# Patient Record
Sex: Female | Born: 1950
Health system: Southern US, Community
[De-identification: ages and names within clinical notes are randomized; demographics above are authoritative.]

## PROBLEM LIST (undated history)

## (undated) DIAGNOSIS — E213 Hyperparathyroidism, unspecified: Secondary | ICD-10-CM

## (undated) DIAGNOSIS — F419 Anxiety disorder, unspecified: Secondary | ICD-10-CM

## (undated) DIAGNOSIS — J189 Pneumonia, unspecified organism: Secondary | ICD-10-CM

## (undated) DIAGNOSIS — I10 Essential (primary) hypertension: Secondary | ICD-10-CM

## (undated) DIAGNOSIS — E785 Hyperlipidemia, unspecified: Secondary | ICD-10-CM

## (undated) DIAGNOSIS — E559 Vitamin D deficiency, unspecified: Secondary | ICD-10-CM

## (undated) DIAGNOSIS — K219 Gastro-esophageal reflux disease without esophagitis: Secondary | ICD-10-CM

## (undated) DIAGNOSIS — J449 Chronic obstructive pulmonary disease, unspecified: Secondary | ICD-10-CM

## (undated) DIAGNOSIS — M1990 Unspecified osteoarthritis, unspecified site: Secondary | ICD-10-CM

## (undated) HISTORY — PX: APPENDECTOMY: SHX54

## (undated) HISTORY — DX: Hyperparathyroidism, unspecified: E21.3

## (undated) HISTORY — DX: Essential (primary) hypertension: I10

## (undated) HISTORY — DX: Hyperlipidemia, unspecified: E78.5

## (undated) HISTORY — DX: Vitamin D deficiency, unspecified: E55.9

## (undated) HISTORY — DX: Gastro-esophageal reflux disease without esophagitis: K21.9

---

## 1998-07-23 ENCOUNTER — Ambulatory Visit (HOSPITAL_COMMUNITY): Admission: RE | Admit: 1998-07-23 | Discharge: 1998-07-23 | Payer: Self-pay | Admitting: Family Medicine

## 2001-10-01 ENCOUNTER — Encounter: Payer: Self-pay | Admitting: Internal Medicine

## 2001-10-01 ENCOUNTER — Ambulatory Visit (HOSPITAL_COMMUNITY): Admission: RE | Admit: 2001-10-01 | Discharge: 2001-10-01 | Payer: Self-pay | Admitting: Internal Medicine

## 2002-01-28 ENCOUNTER — Ambulatory Visit (HOSPITAL_COMMUNITY): Admission: RE | Admit: 2002-01-28 | Discharge: 2002-01-28 | Payer: Self-pay | Admitting: Internal Medicine

## 2002-01-28 ENCOUNTER — Encounter: Payer: Self-pay | Admitting: Internal Medicine

## 2002-10-03 ENCOUNTER — Encounter: Payer: Self-pay | Admitting: Internal Medicine

## 2002-10-03 ENCOUNTER — Ambulatory Visit (HOSPITAL_COMMUNITY): Admission: RE | Admit: 2002-10-03 | Discharge: 2002-10-03 | Payer: Self-pay | Admitting: Internal Medicine

## 2003-10-02 ENCOUNTER — Emergency Department (HOSPITAL_COMMUNITY): Admission: EM | Admit: 2003-10-02 | Discharge: 2003-10-02 | Payer: Self-pay | Admitting: Emergency Medicine

## 2005-03-06 ENCOUNTER — Emergency Department (HOSPITAL_COMMUNITY): Admission: EM | Admit: 2005-03-06 | Discharge: 2005-03-06 | Payer: Self-pay | Admitting: Emergency Medicine

## 2006-02-03 ENCOUNTER — Ambulatory Visit (HOSPITAL_COMMUNITY): Admission: RE | Admit: 2006-02-03 | Discharge: 2006-02-03 | Payer: Self-pay | Admitting: Internal Medicine

## 2006-04-21 ENCOUNTER — Ambulatory Visit (HOSPITAL_COMMUNITY): Admission: RE | Admit: 2006-04-21 | Discharge: 2006-04-21 | Payer: Self-pay | Admitting: Internal Medicine

## 2006-09-03 ENCOUNTER — Ambulatory Visit (HOSPITAL_COMMUNITY): Admission: RE | Admit: 2006-09-03 | Discharge: 2006-09-03 | Payer: Self-pay | Admitting: Gastroenterology

## 2006-09-03 ENCOUNTER — Ambulatory Visit: Payer: Self-pay | Admitting: Gastroenterology

## 2006-09-03 HISTORY — PX: COLONOSCOPY: SHX174

## 2007-02-24 ENCOUNTER — Ambulatory Visit (HOSPITAL_COMMUNITY): Admission: RE | Admit: 2007-02-24 | Discharge: 2007-02-24 | Payer: Self-pay | Admitting: Internal Medicine

## 2007-06-10 ENCOUNTER — Ambulatory Visit (HOSPITAL_COMMUNITY): Admission: RE | Admit: 2007-06-10 | Discharge: 2007-06-10 | Payer: Self-pay | Admitting: Internal Medicine

## 2008-09-19 ENCOUNTER — Ambulatory Visit (HOSPITAL_COMMUNITY): Admission: RE | Admit: 2008-09-19 | Discharge: 2008-09-19 | Payer: Self-pay | Admitting: Internal Medicine

## 2009-10-04 ENCOUNTER — Ambulatory Visit (HOSPITAL_COMMUNITY): Admission: RE | Admit: 2009-10-04 | Discharge: 2009-10-04 | Payer: Self-pay | Admitting: Internal Medicine

## 2010-10-07 ENCOUNTER — Ambulatory Visit (HOSPITAL_COMMUNITY): Admission: RE | Admit: 2010-10-07 | Discharge: 2010-10-07 | Payer: Self-pay | Admitting: Internal Medicine

## 2010-12-29 ENCOUNTER — Encounter: Payer: Self-pay | Admitting: Internal Medicine

## 2011-04-25 NOTE — Op Note (Signed)
[name], Stephanie [name]              ACCOUNT NO.:  [id number]   MEDICAL RECORD NO.:  [id number]          PATIENT TYPE:  AMB   LOCATION:  DAY                           FACILITY:  [facility]   PHYSICIAN:  Stephanie Stephanie [name], M.D.      DATE OF BIRTH:  23-Oct-1951   DATE OF PROCEDURE:  DATE OF DISCHARGE:  09/03/2006                                 OPERATIVE REPORT   DATE OF PROCEDURE:  09/03/2006   REFERRING PHYSICIAN:  Tesfaye D. Legrand Rams, MD   PROCEDURE:  Colonoscopy   FINDINGS:  1. Rare diverticulosis in the sigmoid colon.  Otherwise normal colon      without evidence of polyps, masses, inflammatory changes or vascular      ectasias.  2. Large internal hemorrhoids.  Otherwise normal retroflexed view of the      rectum.   RECOMMENDATIONS:  1. Screening colonoscopy in 10 years.  2. High fiber diet.  Handout given on high fiber diet, internal      hemorrhoids and diverticulosis.  3. Follow up with Dr. Legrand [name].   MEDICATIONS:  1. Demerol 100 mg IV  2. Versed 4 mg IV.   PROCEDURE TECHNIQUE:  Physical exam was performed and informed consent was  obtained from the patient after explaining the benefits, risks and  alternatives to the procedure which the patient appeared to understand and  so stated.  The patient was connected to the monitor and placed in the left  lateral position.  Continuous oxygen was provided by nasal cannula and IV  medicine administered through an indwelling cannula.  After administration  of sedation and rectal exam, the patient's rectum was intubated.  The scope  was advanced under direct visualization to  the cecum.  The scope was withdrawn slowly by carefully examining the color,  texture, anatomy and integrity of the mucosa on the way out.  The patient  was recovered in the endoscopy suite and discharged home in satisfactory  condition.      Stephanie Stephanie [name], M.D.  Electronically Signed     SM/MEDQ  D:  09/03/2006  T:  09/05/2006  Job:  [id number]   cc:   Stephanie D.  Legrand Rams, MD  Fax: [phone]

## 2011-09-30 ENCOUNTER — Other Ambulatory Visit (HOSPITAL_COMMUNITY): Payer: Self-pay | Admitting: Internal Medicine

## 2011-09-30 DIAGNOSIS — Z139 Encounter for screening, unspecified: Secondary | ICD-10-CM

## 2011-10-10 ENCOUNTER — Ambulatory Visit (HOSPITAL_COMMUNITY)
Admission: RE | Admit: 2011-10-10 | Discharge: 2011-10-10 | Disposition: A | Discharge status: Home / Self Care | Payer: 59 | Source: Ambulatory Visit | Attending: Internal Medicine | Admitting: Internal Medicine

## 2011-10-10 DIAGNOSIS — Z1231 Encounter for screening mammogram for malignant neoplasm of breast: Secondary | ICD-10-CM | POA: Insufficient documentation

## 2011-10-10 DIAGNOSIS — Z139 Encounter for screening, unspecified: Secondary | ICD-10-CM

## 2011-10-13 ENCOUNTER — Ambulatory Visit (HOSPITAL_COMMUNITY): Payer: Self-pay

## 2014-10-16 ENCOUNTER — Ambulatory Visit (HOSPITAL_COMMUNITY)
Admission: RE | Admit: 2014-10-16 | Discharge: 2014-10-16 | Disposition: A | Discharge status: Home / Self Care | Payer: 59 | Source: Ambulatory Visit | Attending: Internal Medicine | Admitting: Internal Medicine

## 2014-10-16 ENCOUNTER — Other Ambulatory Visit (HOSPITAL_COMMUNITY): Payer: Self-pay | Admitting: Internal Medicine

## 2014-10-16 DIAGNOSIS — F17200 Nicotine dependence, unspecified, uncomplicated: Secondary | ICD-10-CM | POA: Insufficient documentation

## 2014-10-16 DIAGNOSIS — M25551 Pain in right hip: Secondary | ICD-10-CM | POA: Diagnosis not present

## 2014-10-16 DIAGNOSIS — J9809 Other diseases of bronchus, not elsewhere classified: Secondary | ICD-10-CM

## 2015-09-10 ENCOUNTER — Other Ambulatory Visit (HOSPITAL_COMMUNITY): Payer: Self-pay | Admitting: Internal Medicine

## 2015-09-10 DIAGNOSIS — Z1231 Encounter for screening mammogram for malignant neoplasm of breast: Secondary | ICD-10-CM

## 2015-09-21 ENCOUNTER — Ambulatory Visit (HOSPITAL_COMMUNITY)
Admission: RE | Admit: 2015-09-21 | Discharge: 2015-09-21 | Disposition: A | Discharge status: Home / Self Care | Payer: 59 | Source: Ambulatory Visit | Attending: Internal Medicine | Admitting: Internal Medicine

## 2015-09-21 DIAGNOSIS — Z1231 Encounter for screening mammogram for malignant neoplasm of breast: Secondary | ICD-10-CM | POA: Diagnosis not present

## 2016-03-18 ENCOUNTER — Telehealth: Payer: Self-pay

## 2016-03-18 NOTE — Telephone Encounter (Signed)
I called pt. She had her last colonoscopy 09/03/2006 by Dr. Oneida Alar. She was on recall for 08/2016. Pt said she had a recent episode of bloody diarrhea.  OV with Neil Crouch, PA on 04/11/2016 at 10:00 Am .

## 2016-03-18 NOTE — Telephone Encounter (Signed)
Pt had called yesterday to speak with DS. I told her that DS had already left for the day. Pt received a triage letter.  She said that she doesn't get home from work until 330pm and for DS to call her then. [id number]

## 2016-04-11 ENCOUNTER — Ambulatory Visit: Payer: 59 | Admitting: Gastroenterology

## 2016-04-25 ENCOUNTER — Ambulatory Visit (INDEPENDENT_AMBULATORY_CARE_PROVIDER_SITE_OTHER): Payer: Managed Care, Other (non HMO) | Admitting: Gastroenterology

## 2016-04-25 ENCOUNTER — Encounter: Payer: Self-pay | Admitting: Gastroenterology

## 2016-04-25 ENCOUNTER — Other Ambulatory Visit: Payer: Self-pay

## 2016-04-25 VITALS — BP 144/84 | HR 68 | Temp 97.6°F | Ht 62.0 in | Wt 130.0 lb

## 2016-04-25 DIAGNOSIS — K529 Noninfective gastroenteritis and colitis, unspecified: Secondary | ICD-10-CM | POA: Diagnosis not present

## 2016-04-25 DIAGNOSIS — Z1211 Encounter for screening for malignant neoplasm of colon: Secondary | ICD-10-CM | POA: Diagnosis not present

## 2016-04-25 MED ORDER — PEG 3350-KCL-NA BICARB-NACL 420 G PO SOLR: 4000.0000 mL | Freq: Once | ORAL | Status: DC

## 2016-04-25 NOTE — Progress Notes (Signed)
Primary Care Physician:  Rosita Fire, MD  Primary Gastroenterologist:  Barney Drain, MD   Chief Complaint  Patient presents with  . Colonoscopy    HPI:  Stephanie [name] is a 65 y.o. female here to schedule screening colonoscopy. Her last colonoscopy in 2007 showed rare diverticulosis and large internal hemorrhoids. She typically has BM about every other day. Usually can have BM after coffee. No melena. Recently had viral gastroenteritis with diarrhea for 2 days. Noted some brbpr with mucous after several bouts of diarrhea. Completely resolved. Has taken omeprazole on occasion for epigastric pain over the past 18 months. Will take for a day or until every few months. Last time over three months ago. No dysphagia or vomiting. No unintentional weight loss.     Current Outpatient Prescriptions  Medication Sig Dispense Refill  . losartan-hydrochlorothiazide (HYZAAR) 100-25 MG tablet Take 1 tablet by mouth daily.     . naproxen (NAPROSYN) 500 MG tablet Take 500 mg by mouth as needed.    Marland Kitchen omeprazole (PRILOSEC) 20 MG capsule Take 20 mg by mouth daily.    . simvastatin (ZOCOR) 20 MG tablet Take 20 mg by mouth daily at 6 PM.      No current facility-administered medications for this visit.    Allergies as of 04/25/2016  . (No Known Allergies)    Past Medical History  Diagnosis Date  . HTN (hypertension)   . Hyperlipidemia   . GERD (gastroesophageal reflux disease)     Past Surgical History  Procedure Laterality Date  . Colonoscopy  09/03/2006    [id number] diverticulosis in the sigmoid colon. Otherwise normal colon  . Appendectomy      Family History  Problem Relation Age of Onset  . Colon cancer Neg Hx     Social History   Social History  . Marital Status: Legally Separated    Spouse Name: N/A  . Number of Children: 1  . Years of Education: N/A   Occupational History  . inspector    Social History Main Topics  . Smoking status: Current Every Day Smoker -- 1.00  packs/day    Types: Cigarettes  . Smokeless tobacco: Not on file  . Alcohol Use: 3.6 oz/week    6 Standard drinks or equivalent per week     Comment: 6 pack in a week  . Drug Use: No  . Sexual Activity: Not on file   Other Topics Concern  . Not on file   Social History Narrative  . No narrative on file      ROS:  General: Negative for anorexia, weight loss, fever, chills, fatigue, weakness. Eyes: Negative for vision changes.  ENT: Negative for hoarseness, difficulty swallowing , nasal congestion. CV: Negative for chest pain, angina, palpitations, dyspnea on exertion, peripheral edema.  Respiratory: Negative for dyspnea at rest, dyspnea on exertion, cough, sputum, wheezing.  GI: See history of present illness. GU:  Negative for dysuria, hematuria, urinary incontinence, urinary frequency, nocturnal urination.  MS: Negative for joint pain, low back pain.  Derm: Negative for rash or itching.  Neuro: Negative for weakness, abnormal sensation, seizure, frequent headaches, memory loss, confusion.  Psych: Negative for anxiety, depression, suicidal ideation, hallucinations.  Endo: Negative for unusual weight change.  Heme: Negative for bruising or bleeding. Allergy: Negative for rash or hives.    Physical Examination:  BP 144/84 mmHg  Pulse 68  Temp(Src) 97.6 F (36.4 C) (Oral)  Ht 5\' 2"  (1.575 m)  Wt 130 lb (58.968 kg)  BMI 23.77  kg/m2   General: Well-nourished, well-developed in no acute distress.  Head: Normocephalic, atraumatic.   Eyes: Conjunctiva pink, no icterus. Mouth: Oropharyngeal mucosa moist and pink , no lesions erythema or exudate. Neck: Supple without thyromegaly, masses, or lymphadenopathy.  Lungs: Clear to auscultation bilaterally.  Heart: Regular rate and rhythm, no murmurs rubs or gallops.  Abdomen: Bowel sounds are normal, nontender, nondistended, no hepatosplenomegaly or masses, no abdominal bruits or    hernia , no rebound or guarding.   Rectal: not  performed Extremities: No lower extremity edema. No clubbing or deformities.  Neuro: Alert and oriented x 4 , grossly normal neurologically.  Skin: Warm and dry, no rash or jaundice.   Psych: Alert and cooperative, normal mood and affect.

## 2016-04-25 NOTE — Assessment & Plan Note (Signed)
65 y/o female who presents to schedule 10 year follow up screening colonoscopy (average risk). Recently had short-lived diarrhea with small volume brbpr with h/o hemorrhoids. Suspected benign anorectal bleeding in setting of viral gastroenteritis. Symptoms resolved. Colonoscopy in near future.  I have discussed the risks, alternatives, benefits with regards to but not limited to the risk of reaction to medication, bleeding, infection, perforation and the patient is agreeable to proceed. Written consent to be obtained.

## 2016-04-25 NOTE — Patient Instructions (Signed)
1. Colonoscopy in the near future. Please see separate instructions.

## 2016-04-28 NOTE — Progress Notes (Signed)
cc'd to pcp 

## 2016-05-23 ENCOUNTER — Ambulatory Visit (HOSPITAL_COMMUNITY)
Admission: RE | Admit: 2016-05-23 | Discharge: 2016-05-23 | Disposition: A | Discharge status: Home / Self Care | Payer: Managed Care, Other (non HMO) | Source: Ambulatory Visit | Attending: Gastroenterology | Admitting: Gastroenterology

## 2016-05-23 ENCOUNTER — Encounter (HOSPITAL_COMMUNITY)
Admission: RE | Disposition: A | Discharge status: Home / Self Care | Payer: Self-pay | Source: Ambulatory Visit | Attending: Gastroenterology

## 2016-05-23 ENCOUNTER — Encounter (HOSPITAL_COMMUNITY): Payer: Self-pay | Admitting: *Deleted

## 2016-05-23 DIAGNOSIS — Q438 Other specified congenital malformations of intestine: Secondary | ICD-10-CM | POA: Insufficient documentation

## 2016-05-23 DIAGNOSIS — Z79899 Other long term (current) drug therapy: Secondary | ICD-10-CM | POA: Insufficient documentation

## 2016-05-23 DIAGNOSIS — Z7982 Long term (current) use of aspirin: Secondary | ICD-10-CM | POA: Insufficient documentation

## 2016-05-23 DIAGNOSIS — K219 Gastro-esophageal reflux disease without esophagitis: Secondary | ICD-10-CM | POA: Diagnosis not present

## 2016-05-23 DIAGNOSIS — E785 Hyperlipidemia, unspecified: Secondary | ICD-10-CM | POA: Diagnosis not present

## 2016-05-23 DIAGNOSIS — Z1211 Encounter for screening for malignant neoplasm of colon: Secondary | ICD-10-CM | POA: Diagnosis not present

## 2016-05-23 DIAGNOSIS — K5730 Diverticulosis of large intestine without perforation or abscess without bleeding: Secondary | ICD-10-CM | POA: Diagnosis not present

## 2016-05-23 DIAGNOSIS — I10 Essential (primary) hypertension: Secondary | ICD-10-CM | POA: Insufficient documentation

## 2016-05-23 DIAGNOSIS — F17210 Nicotine dependence, cigarettes, uncomplicated: Secondary | ICD-10-CM | POA: Diagnosis not present

## 2016-05-23 DIAGNOSIS — K648 Other hemorrhoids: Secondary | ICD-10-CM | POA: Diagnosis not present

## 2016-05-23 HISTORY — PX: COLONOSCOPY: SHX5424

## 2016-05-23 SURGERY — COLONOSCOPY
Anesthesia: Moderate Sedation

## 2016-05-23 MED ORDER — STERILE WATER FOR IRRIGATION IR SOLN
Status: DC | PRN
  Administered 2016-05-23: 10:00:00

## 2016-05-23 MED ORDER — MIDAZOLAM HCL 5 MG/5ML IJ SOLN
INTRAMUSCULAR | Status: DC | PRN
  Administered 2016-05-23: 2 mg via INTRAVENOUS
  Administered 2016-05-23: 1 mg via INTRAVENOUS
  Administered 2016-05-23: 2 mg via INTRAVENOUS

## 2016-05-23 MED ORDER — MEPERIDINE HCL 100 MG/ML IJ SOLN
INTRAMUSCULAR | Status: DC
Start: 2016-05-23 — End: 2016-05-23
  Filled 2016-05-23: qty 2

## 2016-05-23 MED ORDER — MIDAZOLAM HCL 5 MG/5ML IJ SOLN
INTRAMUSCULAR | Status: AC
  Filled 2016-05-23: qty 10

## 2016-05-23 MED ORDER — MEPERIDINE HCL 100 MG/ML IJ SOLN
INTRAMUSCULAR | Status: DC | PRN
  Administered 2016-05-23 (×2): 50 mg via INTRAVENOUS

## 2016-05-23 MED ORDER — SODIUM CHLORIDE 0.9 % IV SOLN
INTRAVENOUS | Status: DC
  Administered 2016-05-23: 09:00:00 via INTRAVENOUS

## 2016-05-23 NOTE — H&P (Signed)
  Primary Care Physician:  Rosita Fire, MD Primary Gastroenterologist:  Dr. Oneida Alar  Pre-Procedure History & Physical: HPI:  Stephanie [name] is a 65 y.o. female here for [place].  Past Medical History  Diagnosis Date  . HTN (hypertension)   . Hyperlipidemia   . GERD (gastroesophageal reflux disease)     Past Surgical History  Procedure Laterality Date  . Colonoscopy  09/03/2006    [id number] diverticulosis in the sigmoid colon. Otherwise normal colon  . Appendectomy      Prior to Admission medications   Medication Sig Start Date End Date Taking? Authorizing Provider  aspirin EC 81 MG tablet Take 81 mg by mouth daily.   Yes Historical Provider, MD  losartan-hydrochlorothiazide (HYZAAR) 100-25 MG tablet Take 1 tablet by mouth daily.  04/03/16  Yes Historical Provider, MD  naproxen (NAPROSYN) 500 MG tablet Take 500 mg by mouth as needed.   Yes Historical Provider, MD  omeprazole (PRILOSEC) 20 MG capsule Take 20 mg by mouth daily.   Yes Historical Provider, MD  polyethylene glycol-electrolytes (NULYTELY/GOLYTELY) 420 g solution Take 4,000 mLs by mouth once. 04/25/16  Yes Mahala Menghini, PA-C  simvastatin (ZOCOR) 20 MG tablet Take 20 mg by mouth daily at 6 PM.  04/03/16  Yes Historical Provider, MD    Allergies as of 04/25/2016  . (No Known Allergies)    Family History  Problem Relation Age of Onset  . Colon cancer Neg Hx     Social History   Social History  . Marital Status: Legally Separated    Spouse Name: N/A  . Number of Children: 1  . Years of Education: N/A   Occupational History  . inspector    Social History Main Topics  . Smoking status: Current Every Day Smoker -- 1.00 packs/day for 20 years    Types: Cigarettes  . Smokeless tobacco: Not on file  . Alcohol Use: 7.2 oz/week    6 Standard drinks or equivalent, 6 Cans of beer per week     Comment: 6 pack in a week  . Drug Use: No  . Sexual Activity: Not on file   Other Topics Concern  . Not  on file   Social History Narrative    Review of Systems: See HPI, otherwise negative ROS   Physical Exam: BP   Pulse 70  Temp(Src) 97.5 F (36.4 C) (Oral)  Resp 19  Ht 5\' 2"  (1.575 m)  Wt 130 lb (58.968 kg)  BMI 23.77 kg/m2  SpO2 99% General:   Alert,  pleasant and cooperative in NAD Head:  Normocephalic and atraumatic. Neck:  Supple; Lungs:  Clear throughout to auscultation.    Heart:  Regular rate and rhythm. Abdomen:  Soft, nontender and nondistended. Normal bowel sounds, without guarding, and without rebound.   Neurologic:  Alert and  oriented x4;  grossly normal neurologically.  Impression/Plan:     SCREENING  Plan:  1. TCS TODAY

## 2016-05-23 NOTE — Progress Notes (Signed)
REVIEWED-NO ADDITIONAL RECOMMENDATIONS. 

## 2016-05-23 NOTE — OR Nursing (Signed)
Dr. Oneida Alar notified of increased BP 215/81, 205/83, pulse 65-68 and of patient taking Hyzaar yesterday. No orders received, will proceed with procedure.

## 2016-05-23 NOTE — Op Note (Signed)
[facility] Patient Name: Stephanie [name] Procedure Date: 05/23/2016 9:38 AM MRN: [id number] Date of Birth: 09-08-1951 Attending MD: Barney Drain , MD CSN: [id number] Age: 65 Admit Type: Outpatient Procedure:                Colonoscopy, SCREENING Indications:              Screening for colorectal malignant neoplasm Providers:                Barney Drain, MD, Janeece Riggers, RN, Randa Spike,                            Technician Referring MD:             Rosita Fire Medicines:                Meperidine 100 mg IV, Midazolam 5 mg IV Complications:            No immediate complications. Estimated Blood Loss:     Estimated blood loss was minimal. Procedure:                Pre-Anesthesia Assessment:                           - Prior to the procedure, a History and Physical                            was performed, and patient medications and                            allergies were reviewed. The patient's tolerance of                            previous anesthesia was also reviewed. The risks                            and benefits of the procedure and the sedation                            options and risks were discussed with the patient.                            All questions were answered, and informed consent                            was obtained. Prior Anticoagulants: The patient has                            taken aspirin, last dose was day of procedure. ASA                            Grade Assessment: II - A patient with mild systemic                            disease. After reviewing the risks and benefits,  the patient was deemed in satisfactory condition to                            undergo the procedure.                           After obtaining informed consent, the colonoscope                            was passed under direct vision. Throughout the                            procedure, the patient's blood pressure, pulse, and                     oxygen saturations were monitored continuously. The                            [id number]) scope was introduced through                            the anus and advanced to the the cecum, identified                            by appendiceal orifice and ileocecal valve. The                            ileocecal valve, appendiceal orifice, and rectum                            were photographed. The colonoscopy was somewhat                            difficult due to a tortuous colon. Successful                            completion of the procedure was aided by                            straightening and shortening the scope to obtain                            bowel loop reduction. The quality of the bowel                            preparation was excellent. Scope In: 10:14:19 AM Scope Out: 10:29:39 AM Scope Withdrawal Time: 0 hours 12 minutes 3 seconds  Total Procedure Duration: 0 hours 15 minutes 20 seconds  Findings:      The digital rectal exam findings include non-thrombosed external       hemorrhoids.      The recto-sigmoid colon and sigmoid colon were moderately redundant.       Advancing the scope required straightening and shortening the scope to       obtain bowel loop reduction.      A few small and large-mouthed diverticula  were found in the sigmoid       colon, hepatic flexure and ascending colon.      Non-bleeding external and internal hemorrhoids were found during       retroflexion. The hemorrhoids were medium-sized. Impression:               - MODERATELY Redundant colon.                           - MILD Diverticulosis in the sigmoid colon, at the                            hepatic flexure and in the ascending colon.                           - Non-bleeding external and internal hemorrhoids. Moderate Sedation:      Moderate (conscious) sedation was administered by the endoscopy nurse       and supervised by the endoscopist. The following  parameters were       monitored: oxygen saturation, heart rate, blood pressure, and response       to care. Total physician intraservice time was 35 minutes. Recommendation:           - High fiber diet.                           - Continue present medications.                           - Repeat colonoscopy in 10 years for surveillance.                           - Patient has a contact number available for                            emergencies. The signs and symptoms of potential                            delayed complications were discussed with the                            patient. Return to normal activities tomorrow.                            Written discharge instructions were provided to the                            patient. Procedure Code(s):        --- Professional ---                           [id number], Colonoscopy, flexible; diagnostic, including                            collection of specimen(s) by brushing or washing,  when performed (separate procedure)                           [id number], Moderate sedation services; each additional                            15 minutes intraservice time                           G0500, Moderate sedation services provided by the                            same physician or other qualified health care                            professional performing a gastrointestinal                            endoscopic service that sedation supports,                            requiring the presence of an independent trained                            observer to assist in the monitoring of the                            patient's level of consciousness and physiological                            status; initial 15 minutes of intra-service time;                            patient age 66 years or older (additional time may                            be reported with [phone], as appropriate) Diagnosis Code(s):        ---  Professional ---                           Z12.11, Encounter for screening for malignant                            neoplasm of colon                           K64.4, Residual hemorrhoidal skin tags                           K64.8, Other hemorrhoids                           K57.30, Diverticulosis of large intestine without                            perforation or abscess without  bleeding                           Q43.8, Other specified congenital malformations of                            intestine CPT copyright 2016 [employer]. All rights reserved. The codes documented in this report are preliminary and upon coder review may  be revised to meet current compliance requirements. Barney Drain, MD Barney Drain, MD 05/23/2016 10:47:36 AM This report has been signed electronically. Number of Addenda: 0

## 2016-05-23 NOTE — Discharge Instructions (Signed)
You have MODERATE internal AND EXTERNAL hemorrhoids and diverticulosis IN YOUR RIGHT AND LEFT COLON.   DRINK WATER TO KEEP YOUR URINE LIGHT YELLOW.  FOLLOW A HIGH FIBER DIET. AVOID ITEMS THAT CAUSE BLOATING & GAS. See info below.  Next colonoscopy in 10 years. Colonoscopy Care After Read the instructions outlined below and refer to this sheet in the next week. These discharge instructions provide you with general information on caring for yourself after you leave the hospital. While your treatment has been planned according to the most current medical practices available, unavoidable complications occasionally occur. If you have any problems or questions after discharge, call DR. Arne Schlender, [phone].  ACTIVITY  You may resume your regular activity, but move at a slower pace for the next 24 hours.   Take frequent rest periods for the next 24 hours.   Walking will help get rid of the air and reduce the bloated feeling in your belly (abdomen).   No driving for 24 hours (because of the medicine (anesthesia) used during the test).   You may shower.   Do not sign any important legal documents or operate any machinery for 24 hours (because of the anesthesia used during the test).    NUTRITION  Drink plenty of fluids.   You may resume your normal diet as instructed by your doctor.   Begin with a light meal and progress to your normal diet. Heavy or fried foods are harder to digest and may make you feel sick to your stomach (nauseated).   Avoid alcoholic beverages for 24 hours or as instructed.    MEDICATIONS  You may resume your normal medications.   WHAT YOU CAN EXPECT TODAY  Some feelings of bloating in the abdomen.   Passage of more gas than usual.   Spotting of blood in your stool or on the toilet paper  .  IF YOU HAD POLYPS REMOVED DURING THE COLONOSCOPY:  Eat a soft diet IF YOU HAVE NAUSEA, BLOATING, ABDOMINAL PAIN, OR VOMITING.    FINDING OUT THE RESULTS OF  YOUR TEST Not all test results are available during your visit. DR. Oneida Alar WILL CALL YOU WITHIN 7 DAYS OF YOUR PROCEDUE WITH YOUR RESULTS. Do not assume everything is normal if you have not heard from DR. Muriel Hannold IN ONE WEEK, CALL HER OFFICE AT [phone].  SEEK IMMEDIATE MEDICAL ATTENTION AND CALL THE OFFICE: [phone] IF:  You have more than a spotting of blood in your stool.   Your belly is swollen (abdominal distention).   You are nauseated or vomiting.   You have a temperature over 101F.   You have abdominal pain or discomfort that is severe or gets worse throughout the day.  High-Fiber Diet A high-fiber diet changes your normal diet to include more whole grains, legumes, fruits, and vegetables. Changes in the diet involve replacing refined carbohydrates with unrefined foods. The calorie level of the diet is essentially unchanged. The Dietary Reference Intake (recommended amount) for adult males is 38 grams per day. For adult females, it is 25 grams per day. Pregnant and lactating women should consume 28 grams of fiber per day. Fiber is the intact part of a plant that is not broken down during digestion. Functional fiber is fiber that has been isolated from the plant to provide a beneficial effect in the body. PURPOSE  Increase stool bulk.   Ease and regulate bowel movements.   Lower cholesterol.  REDUCE RISK OF COLON CANCER  INDICATIONS THAT YOU NEED MORE FIBER  Constipation and hemorrhoids.   Uncomplicated diverticulosis (intestine condition) and irritable bowel syndrome.   Weight management.   As a protective measure against hardening of the arteries (atherosclerosis), diabetes, and cancer.   GUIDELINES FOR INCREASING FIBER IN THE DIET  Start adding fiber to the diet slowly. A gradual increase of about 5 more grams (2 slices of whole-wheat bread, 2 servings of most fruits or vegetables, or 1 bowl of high-fiber cereal) per day is best. Too rapid an increase in fiber  may result in constipation, flatulence, and bloating.   Drink enough water and fluids to keep your urine clear or pale yellow. Water, juice, or caffeine-free drinks are recommended. Not drinking enough fluid may cause constipation.   Eat a variety of high-fiber foods rather than one type of fiber.   Try to increase your intake of fiber through using high-fiber foods rather than fiber pills or supplements that contain small amounts of fiber.   The goal is to change the types of food eaten. Do not supplement your present diet with high-fiber foods, but replace foods in your present diet.   INCLUDE A VARIETY OF FIBER SOURCES  Replace refined and processed grains with whole grains, canned fruits with fresh fruits, and incorporate other fiber sources. White rice, white breads, and most bakery goods contain little or no fiber.   Brown whole-grain rice, buckwheat oats, and many fruits and vegetables are all good sources of fiber. These include: broccoli, Brussels sprouts, cabbage, cauliflower, beets, sweet potatoes, white potatoes (skin on), carrots, tomatoes, eggplant, squash, berries, fresh fruits, and dried fruits.   Cereals appear to be the richest source of fiber. Cereal fiber is found in whole grains and bran. Bran is the fiber-rich outer coat of cereal grain, which is largely removed in refining. In whole-grain cereals, the bran remains. In breakfast cereals, the largest amount of fiber is found in those with "bran" in their names. The fiber content is sometimes indicated on the label.   You may need to include additional fruits and vegetables each day.   In baking, for 1 cup white flour, you may use the following substitutions:   1 cup whole-wheat flour minus 2 tablespoons.   1/2 cup white flour plus 1/2 cup whole-wheat flour.   Diverticulosis Diverticulosis is a common condition that develops when small pouches (diverticula) form in the wall of the colon. The risk of diverticulosis  increases with age. It happens more often in people who eat a low-fiber diet. Most individuals with diverticulosis have no symptoms. Those individuals with symptoms usually experience belly (abdominal) pain, constipation, or loose stools (diarrhea).  HOME CARE INSTRUCTIONS  Increase the amount of fiber in your diet as directed by your caregiver or dietician. This may reduce symptoms of diverticulosis.   Drink at least 6 to 8 glasses of water each day to prevent constipation.   Try not to strain when you have a bowel movement.   Avoiding nuts and seeds to prevent complications is NOT NECESSARY.    FOODS HAVING HIGH FIBER CONTENT INCLUDE:  Fruits. Apple, peach, pear, tangerine, raisins, prunes.   Vegetables. Brussels sprouts, asparagus, broccoli, cabbage, carrot, cauliflower, romaine lettuce, spinach, summer squash, tomato, winter squash, zucchini.   Starchy Vegetables. Baked beans, kidney beans, lima beans, split peas, lentils, potatoes (with skin).   Grains. Whole wheat bread, brown rice, bran flake cereal, plain oatmeal, white rice, shredded wheat, bran muffins.   SEEK IMMEDIATE MEDICAL CARE IF:  You develop increasing pain or severe bloating.  You have an oral temperature above 101F.   You develop vomiting or bowel movements that are bloody or black.   Hemorrhoids Hemorrhoids are dilated (enlarged) veins around the rectum. Sometimes clots will form in the veins. This makes them swollen and painful. These are called thrombosed hemorrhoids. Causes of hemorrhoids include:  Constipation.   Straining to have a bowel movement.   HEAVY LIFTING  HOME CARE INSTRUCTIONS  Eat a well balanced diet and drink 6 to 8 glasses of water every day to avoid constipation. You may also use a bulk laxative.   Avoid straining to have bowel movements.   Keep anal area dry and clean.   Do not use a donut shaped pillow or sit on the toilet for long periods. This increases blood pooling and  pain.   Move your bowels when your body has the urge; this will require less straining and will decrease pain and pressure.

## 2016-05-27 ENCOUNTER — Encounter (HOSPITAL_COMMUNITY): Payer: Self-pay | Admitting: Gastroenterology

## 2016-09-11 ENCOUNTER — Other Ambulatory Visit (HOSPITAL_COMMUNITY): Payer: Self-pay | Admitting: Internal Medicine

## 2016-09-11 DIAGNOSIS — Z1231 Encounter for screening mammogram for malignant neoplasm of breast: Secondary | ICD-10-CM

## 2016-09-25 ENCOUNTER — Ambulatory Visit (HOSPITAL_COMMUNITY): Payer: Managed Care, Other (non HMO)

## 2016-09-25 ENCOUNTER — Ambulatory Visit (HOSPITAL_COMMUNITY)
Admission: RE | Admit: 2016-09-25 | Discharge: 2016-09-25 | Disposition: A | Discharge status: Home / Self Care | Payer: Managed Care, Other (non HMO) | Source: Ambulatory Visit | Attending: Internal Medicine | Admitting: Internal Medicine

## 2016-09-25 ENCOUNTER — Other Ambulatory Visit (HOSPITAL_COMMUNITY): Payer: Self-pay | Admitting: Internal Medicine

## 2016-09-25 DIAGNOSIS — Z1231 Encounter for screening mammogram for malignant neoplasm of breast: Secondary | ICD-10-CM | POA: Insufficient documentation

## 2016-11-28 ENCOUNTER — Other Ambulatory Visit (HOSPITAL_COMMUNITY): Payer: Self-pay | Admitting: Internal Medicine

## 2016-11-28 DIAGNOSIS — F17200 Nicotine dependence, unspecified, uncomplicated: Secondary | ICD-10-CM

## 2016-12-10 ENCOUNTER — Ambulatory Visit (HOSPITAL_COMMUNITY): Payer: Managed Care, Other (non HMO)

## 2017-09-24 ENCOUNTER — Other Ambulatory Visit (HOSPITAL_COMMUNITY): Payer: Self-pay | Admitting: Internal Medicine

## 2017-09-24 DIAGNOSIS — Z1231 Encounter for screening mammogram for malignant neoplasm of breast: Secondary | ICD-10-CM

## 2017-10-02 ENCOUNTER — Ambulatory Visit (HOSPITAL_COMMUNITY)
Admission: RE | Admit: 2017-10-02 | Discharge: 2017-10-02 | Disposition: A | Discharge status: Home / Self Care | Payer: Managed Care, Other (non HMO) | Source: Ambulatory Visit | Attending: Internal Medicine | Admitting: Internal Medicine

## 2017-10-02 ENCOUNTER — Encounter (HOSPITAL_COMMUNITY): Payer: Self-pay

## 2017-10-02 DIAGNOSIS — Z1231 Encounter for screening mammogram for malignant neoplasm of breast: Secondary | ICD-10-CM | POA: Insufficient documentation

## 2018-03-30 ENCOUNTER — Other Ambulatory Visit (HOSPITAL_COMMUNITY): Payer: Self-pay | Admitting: Internal Medicine

## 2018-03-30 ENCOUNTER — Ambulatory Visit (HOSPITAL_COMMUNITY)
Admission: RE | Admit: 2018-03-30 | Discharge: 2018-03-30 | Disposition: A | Discharge status: Home / Self Care | Payer: BLUE CROSS/BLUE SHIELD | Source: Ambulatory Visit | Attending: Internal Medicine | Admitting: Internal Medicine

## 2018-03-30 DIAGNOSIS — M25512 Pain in left shoulder: Secondary | ICD-10-CM | POA: Diagnosis not present

## 2018-03-30 DIAGNOSIS — I700 Atherosclerosis of aorta: Secondary | ICD-10-CM | POA: Diagnosis not present

## 2018-07-06 DIAGNOSIS — Z7982 Long term (current) use of aspirin: Secondary | ICD-10-CM | POA: Diagnosis not present

## 2018-07-06 DIAGNOSIS — Z720 Tobacco use: Secondary | ICD-10-CM | POA: Diagnosis not present

## 2018-07-06 DIAGNOSIS — E785 Hyperlipidemia, unspecified: Secondary | ICD-10-CM | POA: Diagnosis not present

## 2018-07-06 DIAGNOSIS — Z8249 Family history of ischemic heart disease and other diseases of the circulatory system: Secondary | ICD-10-CM | POA: Diagnosis not present

## 2018-07-06 DIAGNOSIS — Z825 Family history of asthma and other chronic lower respiratory diseases: Secondary | ICD-10-CM | POA: Diagnosis not present

## 2018-07-06 DIAGNOSIS — Z823 Family history of stroke: Secondary | ICD-10-CM | POA: Diagnosis not present

## 2018-07-06 DIAGNOSIS — R69 Illness, unspecified: Secondary | ICD-10-CM | POA: Diagnosis not present

## 2018-07-06 DIAGNOSIS — I10 Essential (primary) hypertension: Secondary | ICD-10-CM | POA: Diagnosis not present

## 2018-07-19 DIAGNOSIS — I10 Essential (primary) hypertension: Secondary | ICD-10-CM | POA: Diagnosis not present

## 2018-07-19 DIAGNOSIS — E785 Hyperlipidemia, unspecified: Secondary | ICD-10-CM | POA: Diagnosis not present

## 2018-07-19 DIAGNOSIS — J410 Simple chronic bronchitis: Secondary | ICD-10-CM | POA: Diagnosis not present

## 2018-07-19 DIAGNOSIS — Z0001 Encounter for general adult medical examination with abnormal findings: Secondary | ICD-10-CM | POA: Diagnosis not present

## 2018-07-19 DIAGNOSIS — Z0000 Encounter for general adult medical examination without abnormal findings: Secondary | ICD-10-CM | POA: Diagnosis not present

## 2018-07-19 DIAGNOSIS — R69 Illness, unspecified: Secondary | ICD-10-CM | POA: Diagnosis not present

## 2018-07-21 DIAGNOSIS — Z23 Encounter for immunization: Secondary | ICD-10-CM | POA: Diagnosis not present

## 2018-09-24 ENCOUNTER — Other Ambulatory Visit (HOSPITAL_COMMUNITY): Payer: Self-pay | Admitting: Internal Medicine

## 2018-09-24 DIAGNOSIS — Z1231 Encounter for screening mammogram for malignant neoplasm of breast: Secondary | ICD-10-CM

## 2018-10-08 ENCOUNTER — Encounter (HOSPITAL_COMMUNITY): Payer: Self-pay

## 2018-10-08 ENCOUNTER — Ambulatory Visit (HOSPITAL_COMMUNITY)
Admission: RE | Admit: 2018-10-08 | Discharge: 2018-10-08 | Disposition: A | Discharge status: Home / Self Care | Payer: Medicare HMO | Source: Ambulatory Visit | Attending: Internal Medicine | Admitting: Internal Medicine

## 2018-10-08 DIAGNOSIS — Z1231 Encounter for screening mammogram for malignant neoplasm of breast: Secondary | ICD-10-CM | POA: Insufficient documentation

## 2018-10-25 DIAGNOSIS — I10 Essential (primary) hypertension: Secondary | ICD-10-CM | POA: Diagnosis not present

## 2018-10-25 DIAGNOSIS — R7989 Other specified abnormal findings of blood chemistry: Secondary | ICD-10-CM | POA: Diagnosis not present

## 2018-10-27 DIAGNOSIS — I10 Essential (primary) hypertension: Secondary | ICD-10-CM | POA: Diagnosis not present

## 2018-10-27 DIAGNOSIS — R945 Abnormal results of liver function studies: Secondary | ICD-10-CM | POA: Diagnosis not present

## 2018-10-27 DIAGNOSIS — E782 Mixed hyperlipidemia: Secondary | ICD-10-CM | POA: Diagnosis not present

## 2018-10-27 DIAGNOSIS — Z23 Encounter for immunization: Secondary | ICD-10-CM | POA: Diagnosis not present

## 2018-10-27 DIAGNOSIS — J410 Simple chronic bronchitis: Secondary | ICD-10-CM | POA: Diagnosis not present

## 2018-11-02 ENCOUNTER — Encounter: Payer: Self-pay | Admitting: Gastroenterology

## 2019-01-26 ENCOUNTER — Ambulatory Visit: Payer: Medicare HMO | Admitting: Gastroenterology

## 2019-01-26 ENCOUNTER — Encounter: Payer: Self-pay | Admitting: Gastroenterology

## 2019-01-26 DIAGNOSIS — K5901 Slow transit constipation: Secondary | ICD-10-CM | POA: Diagnosis not present

## 2019-01-26 DIAGNOSIS — R740 Nonspecific elevation of levels of transaminase and lactic acid dehydrogenase [LDH]: Secondary | ICD-10-CM | POA: Diagnosis not present

## 2019-01-26 DIAGNOSIS — K5900 Constipation, unspecified: Secondary | ICD-10-CM | POA: Insufficient documentation

## 2019-01-26 DIAGNOSIS — R7401 Elevation of levels of liver transaminase levels: Secondary | ICD-10-CM

## 2019-01-26 NOTE — Progress Notes (Signed)
Subjective:    Patient ID: Stephanie [name], female    DOB: February 23, 1951, 68 y.o.   MRN: [id number]  Rosita Fire, MD  HPI Here because liver enzymes are ELEVATED. No prior EPISODES OF JAUNDICE. NO NEW MEDS OR OTC MEDS. NO pRBCs OR TATTOOS. NO STDs. DRINKS ETOH 15-20 OZ(BEER, OR RARE LIQUOR) OVER 10 YEARS. OCCASIONALLY BRBPR: > 1-2X/MO DUE TO CONSTIPATION. DOESN'T DRINK ENOUGH WATER. NOT MUCH FIBER. WILL DRINK PRUNE JUICE. NO COFFEE.  PT DENIES FEVER, CHILLS, HEMATOCHEZIA, HEMATEMESIS, nausea, vomiting, melena, diarrhea, CHEST PAIN, SHORTNESS OF BREATH,  CHANGE IN BOWEL IN HABITS,  abdominal pain, problems swallowing, problems with sedation, OR heartburn or indigestion. NO ITCHING, YELLOW EYES/SKIN, OR FATIGUE. SMOKES A PACK A DAY.    Past Medical History:  Diagnosis Date  . GERD (gastroesophageal reflux disease)   . HTN (hypertension)   . Hyperlipidemia    Past Surgical History:  Procedure Laterality Date  . APPENDECTOMY    . COLONOSCOPY  09/03/2006   NFA:OZHY diverticulosis in the sigmoid colon. Otherwise normal colon  . COLONOSCOPY N/A 05/23/2016   Procedure: COLONOSCOPY;  Surgeon: Danie Binder, MD;  Location: [facility];  Service: Endoscopy;  Laterality: N/A;  1000   No Known Allergies  Current Outpatient Medications  Medication Sig    . aspirin EC 81 MG tablet Take 81 mg by mouth daily.    Marland Kitchen atorvastatin (LIPITOR) 80 MG tablet Take 80 mg by mouth daily.    . hydrochlorothiazide (HYDRODIURIL) 25 MG tablet Take 25 mg by mouth daily.    Marland Kitchen losartan (COZAAR) 100 MG tablet Take 100 mg by mouth daily.    .      . naproxen (NAPROSYN) 500 MG tablet Take 500 mg by mouth as needed. RARE   . omeprazole (PRILOSEC) 20 MG capsule Take 20 mg by mouth daily.    .      .       Family History  Problem Relation Age of Onset  . Colon cancer Neg Hx   . Colon polyps Neg Hx   . Liver disease Neg Hx    Review of Systems PER HPI OTHERWISE ALL SYSTEMS ARE NEGATIVE.    Objective:   Physical Exam Vitals signs reviewed.  Constitutional:      General: She is not in acute distress.    Appearance: She is well-developed.  HENT:     Head: Normocephalic and atraumatic.     Mouth/Throat:     Pharynx: No oropharyngeal exudate.  Eyes:     General: No scleral icterus.    Pupils: Pupils are equal, round, and reactive to light.  Neck:     Musculoskeletal: Normal range of motion and neck supple.  Cardiovascular:     Rate and Rhythm: Normal rate and regular rhythm.     Heart sounds: Normal heart sounds.  Pulmonary:     Effort: Pulmonary effort is normal. No respiratory distress.     Breath sounds: Normal breath sounds.  Abdominal:     General: Bowel sounds are normal. There is no distension.     Palpations: Abdomen is soft.     Tenderness: There is no abdominal tenderness.  Musculoskeletal:     Right lower leg: No edema.     Left lower leg: No edema.  Lymphadenopathy:     Cervical: No cervical adenopathy.  Skin:    General: Skin is warm and dry.  Neurological:     General: No focal deficit present.  Mental Status: She is alert and oriented to person, place, and time. Mental status is at baseline.  Psychiatric:        Mood and Affect: Mood normal.        Behavior: Behavior normal.        Thought Content: Thought content normal.        Assessment & Plan:

## 2019-01-26 NOTE — Assessment & Plan Note (Addendum)
SYMPTOMS NOT IDEALLY CONTROLLED.LIKELY DUE TO SLOW MOTILITY AND DIETARY CHOICES(LOW FIBER/WATER). TCS UP TO DATE.  TO REDUCE CONSTIPATION:   1. DRINK WATER TO KEEP YOUR URINE LIGHT YELLOW.   2. Drink 1/2 cup prune juice 3-4 times a week.   3. FOLLOW A HIGH FIBER DIET. AVOID ITEMS THAT CAUSE BLOATING & GAS.  HANDOUT GIVEN.   4. WHEN NEEDED, USE MILK OF MAGNESIA PILLS OR LIQUID UP TO THREE TIMES A DAY.

## 2019-01-26 NOTE — Progress Notes (Signed)
ON RECALL  °

## 2019-01-26 NOTE — Patient Instructions (Signed)
TO REDUCE CONSTIPATION:    1. DRINK WATER TO KEEP YOUR URINE LIGHT YELLOW.    2. Drink 1/2 cup prune juice 3-4 times a week.    3. FOLLOW A HIGH FIBER DIET. AVOID ITEMS THAT CAUSE BLOATING & GAS. SEE INFO BELOW.    4. WHEN NEEDED, USE MILK OF MAGNESIA PILLS OR LIQUID UP TO THREE TIMES A DAY.   COMPLETE LABS AND ULTRASOUND WITHIN THE NEXT 7-10 DAYS.  YOUR RESULTS WILL BE BACK IN 5 BUSINESS DAYS AFTER COMPLETING THE LABS/ultrasound.   CUT DOWN ON YOU DRINKING.  [facility]CT [facility] TO [facility] IN [facility] CLASS[facility].  FOLLOW UP IN 6 MOS.   High-Fiber Diet A high-fiber diet changes your normal diet to include more whole grains, legumes, fruits, and vegetables. Changes in the diet involve replacing refined carbohydrates with unrefined foods. The calorie level of the diet is essentially unchanged. The Dietary Reference Intake (recommended amount) for adult males is 38 grams per day. For adult females, it is 25 grams per day. Pregnant and lactating women should consume 28 grams of fiber per day. Fiber is the intact part of a plant that is not broken down during digestion. Functional fiber is fiber that has been isolated from the plant to provide a beneficial effect in the body. PURPOSE  Increase stool bulk.   Ease and regulate bowel movements.   Lower cholesterol.   REDUCE RISK OF COLON CANCER  INDICATIONS THAT YOU NEED MORE FIBER  Constipation and hemorrhoids.   Uncomplicated diverticulosis (intestine condition) and irritable bowel syndrome.   Weight management.   As a protective measure against hardening of the arteries (atherosclerosis), diabetes, and cancer.   GUIDELINES FOR INCREASING FIBER IN THE DIET  Start adding fiber to the diet slowly. A gradual increase of about 5 more grams (2 slices of whole-wheat bread, 2 servings of most fruits or vegetables, or 1 bowl of high-fiber cereal) per day is best. Too rapid an increase in fiber may result in constipation, flatulence, and  bloating.   Drink enough water and fluids to keep your urine clear or pale yellow. Water, juice, or caffeine-free drinks are recommended. Not drinking enough fluid may cause constipation.   Eat a variety of high-fiber foods rather than one type of fiber.   Try to increase your intake of fiber through using high-fiber foods rather than fiber pills or supplements that contain small amounts of fiber.   The goal is to change the types of food eaten. Do not supplement your present diet with high-fiber foods, but replace foods in your present diet.   INCLUDE A VARIETY OF FIBER SOURCES  Replace refined and processed grains with whole grains, canned fruits with fresh fruits, and incorporate other fiber sources. White rice, white breads, and most bakery goods contain little or no fiber.   Brown whole-grain rice, buckwheat oats, and many fruits and vegetables are all good sources of fiber. These include: broccoli, Brussels sprouts, cabbage, cauliflower, beets, sweet potatoes, white potatoes (skin on), carrots, tomatoes, eggplant, squash, berries, fresh fruits, and dried fruits.   Cereals appear to be the richest source of fiber. Cereal fiber is found in whole grains and bran. Bran is the fiber-rich outer coat of cereal grain, which is largely removed in refining. In whole-grain cereals, the bran remains. In breakfast cereals, the largest amount of fiber is found in those with "bran" in their names. The fiber content is sometimes indicated on the label.   You may need to include additional  fruits and vegetables each day.   In baking, for 1 cup white flour, you may use the following substitutions:   1 cup whole-wheat flour minus 2 tablespoons.   1/2 cup white flour plus 1/2 cup whole-wheat flour.

## 2019-01-26 NOTE — Assessment & Plan Note (Addendum)
LIKELY DUE TO DAILY ETOH INTAKE AND LIPITOR, LESS LIKELY HEPATITIS A, OR AUTOIMMUNE HEPATITIS.  COMPLETE LABS AND ULTRASOUND WITHIN THE NEXT 7-10 DAYS. YOUR RESULTS WILL BE BACK IN 5 BUSINESS DAYS AFTER COMPLETING THE LABS/ultrasound.  CUT DOWN ON YOU DRINKING. [facility]CT [facility] TO [facility] IN THE [facility] CLASS[facility].  FOLLOW UP IN 6 MOS.

## 2019-01-27 NOTE — Progress Notes (Signed)
cc'd to pcp 

## 2019-01-31 DIAGNOSIS — K219 Gastro-esophageal reflux disease without esophagitis: Secondary | ICD-10-CM | POA: Diagnosis not present

## 2019-01-31 DIAGNOSIS — E785 Hyperlipidemia, unspecified: Secondary | ICD-10-CM | POA: Diagnosis not present

## 2019-01-31 DIAGNOSIS — I10 Essential (primary) hypertension: Secondary | ICD-10-CM | POA: Diagnosis not present

## 2019-01-31 DIAGNOSIS — J410 Simple chronic bronchitis: Secondary | ICD-10-CM | POA: Diagnosis not present

## 2019-02-02 ENCOUNTER — Ambulatory Visit (HOSPITAL_COMMUNITY)
Admission: RE | Admit: 2019-02-02 | Discharge: 2019-02-02 | Disposition: A | Discharge status: Home / Self Care | Payer: Medicare HMO | Source: Ambulatory Visit | Attending: Gastroenterology | Admitting: Gastroenterology

## 2019-02-02 DIAGNOSIS — R7401 Elevation of levels of liver transaminase levels: Secondary | ICD-10-CM

## 2019-02-02 DIAGNOSIS — R740 Nonspecific elevation of levels of transaminase and lactic acid dehydrogenase [LDH]: Secondary | ICD-10-CM | POA: Diagnosis not present

## 2019-02-02 DIAGNOSIS — R7989 Other specified abnormal findings of blood chemistry: Secondary | ICD-10-CM | POA: Diagnosis not present

## 2019-02-02 NOTE — Progress Notes (Signed)
LMOM to call.

## 2019-02-03 NOTE — Progress Notes (Signed)
PT is aware.

## 2019-02-07 LAB — HEPATITIS A ANTIBODY, TOTAL: HEPATITIS A AB,TOTAL: REACTIVE — AB

## 2019-02-07 LAB — ANTI-SMOOTH MUSCLE ANTIBODY, IGG

## 2019-02-07 LAB — IGG, IGA, IGM
IgG (Immunoglobin G), Serum: 1421 mg/dL (ref 600–1540)
IgM, Serum: 47 mg/dL — ABNORMAL LOW (ref 50–300)
Immunoglobulin A: 548 mg/dL — ABNORMAL HIGH (ref 70–320)

## 2019-02-07 LAB — ANA: ANA: NEGATIVE

## 2019-02-07 NOTE — Progress Notes (Signed)
CC'D TO PCP °

## 2019-02-08 NOTE — Progress Notes (Signed)
LM for a return call.  

## 2019-02-08 NOTE — Progress Notes (Signed)
CC'D TO PCP °

## 2019-02-09 ENCOUNTER — Telehealth: Payer: Self-pay | Admitting: Gastroenterology

## 2019-02-09 NOTE — Telephone Encounter (Signed)
Pt was returning a call from yesterday about her results. I told her DS was out today and would let her know that she called. [phone]

## 2019-02-10 NOTE — Progress Notes (Signed)
PT is aware.

## 2019-02-10 NOTE — Telephone Encounter (Signed)
See result note. I have informed pt of lab results.

## 2019-06-29 ENCOUNTER — Encounter: Payer: Self-pay | Admitting: Gastroenterology

## 2019-07-08 ENCOUNTER — Other Ambulatory Visit (HOSPITAL_COMMUNITY): Payer: Self-pay | Admitting: Internal Medicine

## 2019-07-08 ENCOUNTER — Other Ambulatory Visit: Payer: Self-pay | Admitting: Internal Medicine

## 2019-07-08 DIAGNOSIS — E46 Unspecified protein-calorie malnutrition: Secondary | ICD-10-CM | POA: Diagnosis not present

## 2019-07-08 DIAGNOSIS — R945 Abnormal results of liver function studies: Secondary | ICD-10-CM | POA: Diagnosis not present

## 2019-07-08 DIAGNOSIS — J410 Simple chronic bronchitis: Secondary | ICD-10-CM | POA: Diagnosis not present

## 2019-07-08 DIAGNOSIS — I10 Essential (primary) hypertension: Secondary | ICD-10-CM | POA: Diagnosis not present

## 2019-07-08 DIAGNOSIS — Z87891 Personal history of nicotine dependence: Secondary | ICD-10-CM

## 2019-07-12 DIAGNOSIS — I10 Essential (primary) hypertension: Secondary | ICD-10-CM | POA: Diagnosis not present

## 2019-07-12 DIAGNOSIS — E785 Hyperlipidemia, unspecified: Secondary | ICD-10-CM | POA: Diagnosis not present

## 2019-07-12 DIAGNOSIS — R945 Abnormal results of liver function studies: Secondary | ICD-10-CM | POA: Diagnosis not present

## 2019-07-25 ENCOUNTER — Other Ambulatory Visit: Payer: Self-pay

## 2019-07-25 ENCOUNTER — Ambulatory Visit (HOSPITAL_COMMUNITY)
Admission: RE | Admit: 2019-07-25 | Discharge: 2019-07-25 | Disposition: A | Discharge status: Home / Self Care | Payer: Medicare HMO | Source: Ambulatory Visit | Attending: Internal Medicine | Admitting: Internal Medicine

## 2019-07-25 DIAGNOSIS — Z87891 Personal history of nicotine dependence: Secondary | ICD-10-CM | POA: Insufficient documentation

## 2019-07-25 DIAGNOSIS — R69 Illness, unspecified: Secondary | ICD-10-CM | POA: Diagnosis not present

## 2019-07-28 ENCOUNTER — Other Ambulatory Visit: Payer: Self-pay | Admitting: Internal Medicine

## 2019-07-28 DIAGNOSIS — Z1331 Encounter for screening for depression: Secondary | ICD-10-CM | POA: Diagnosis not present

## 2019-07-28 DIAGNOSIS — Z1389 Encounter for screening for other disorder: Secondary | ICD-10-CM | POA: Diagnosis not present

## 2019-07-28 DIAGNOSIS — N184 Chronic kidney disease, stage 4 (severe): Secondary | ICD-10-CM | POA: Diagnosis not present

## 2019-07-28 DIAGNOSIS — R69 Illness, unspecified: Secondary | ICD-10-CM | POA: Diagnosis not present

## 2019-07-28 DIAGNOSIS — I10 Essential (primary) hypertension: Secondary | ICD-10-CM | POA: Diagnosis not present

## 2019-07-28 DIAGNOSIS — E43 Unspecified severe protein-calorie malnutrition: Secondary | ICD-10-CM | POA: Diagnosis not present

## 2019-07-28 DIAGNOSIS — Z0000 Encounter for general adult medical examination without abnormal findings: Secondary | ICD-10-CM | POA: Diagnosis not present

## 2019-08-01 ENCOUNTER — Ambulatory Visit (HOSPITAL_COMMUNITY)
Admission: RE | Admit: 2019-08-01 | Discharge: 2019-08-01 | Disposition: A | Discharge status: Home / Self Care | Payer: Medicare HMO | Source: Ambulatory Visit | Attending: Internal Medicine | Admitting: Internal Medicine

## 2019-08-01 ENCOUNTER — Other Ambulatory Visit: Payer: Self-pay

## 2019-08-01 DIAGNOSIS — N184 Chronic kidney disease, stage 4 (severe): Secondary | ICD-10-CM | POA: Diagnosis not present

## 2019-08-01 DIAGNOSIS — N189 Chronic kidney disease, unspecified: Secondary | ICD-10-CM | POA: Diagnosis not present

## 2019-08-28 DIAGNOSIS — K219 Gastro-esophageal reflux disease without esophagitis: Secondary | ICD-10-CM | POA: Diagnosis not present

## 2019-08-28 DIAGNOSIS — I10 Essential (primary) hypertension: Secondary | ICD-10-CM | POA: Diagnosis not present

## 2019-09-06 DIAGNOSIS — R69 Illness, unspecified: Secondary | ICD-10-CM | POA: Diagnosis not present

## 2019-09-13 ENCOUNTER — Other Ambulatory Visit (HOSPITAL_COMMUNITY): Payer: Self-pay | Admitting: Internal Medicine

## 2019-09-13 DIAGNOSIS — R69 Illness, unspecified: Secondary | ICD-10-CM | POA: Diagnosis not present

## 2019-09-13 DIAGNOSIS — Z1231 Encounter for screening mammogram for malignant neoplasm of breast: Secondary | ICD-10-CM

## 2019-09-27 DIAGNOSIS — I10 Essential (primary) hypertension: Secondary | ICD-10-CM | POA: Diagnosis not present

## 2019-09-27 DIAGNOSIS — E785 Hyperlipidemia, unspecified: Secondary | ICD-10-CM | POA: Diagnosis not present

## 2019-10-12 ENCOUNTER — Other Ambulatory Visit: Payer: Self-pay

## 2019-10-12 ENCOUNTER — Ambulatory Visit (HOSPITAL_COMMUNITY)
Admission: RE | Admit: 2019-10-12 | Discharge: 2019-10-12 | Disposition: A | Discharge status: Home / Self Care | Payer: Medicare HMO | Source: Ambulatory Visit | Attending: Internal Medicine | Admitting: Internal Medicine

## 2019-10-12 DIAGNOSIS — Z1231 Encounter for screening mammogram for malignant neoplasm of breast: Secondary | ICD-10-CM | POA: Diagnosis not present

## 2019-10-28 DIAGNOSIS — K219 Gastro-esophageal reflux disease without esophagitis: Secondary | ICD-10-CM | POA: Diagnosis not present

## 2019-10-28 DIAGNOSIS — I10 Essential (primary) hypertension: Secondary | ICD-10-CM | POA: Diagnosis not present

## 2019-11-18 ENCOUNTER — Other Ambulatory Visit: Payer: Self-pay

## 2019-11-18 DIAGNOSIS — Z20822 Contact with and (suspected) exposure to covid-19: Secondary | ICD-10-CM

## 2019-11-19 LAB — NOVEL CORONAVIRUS, NAA: SARS-CoV-2, NAA: NOT DETECTED

## 2019-11-23 ENCOUNTER — Telehealth: Payer: Self-pay | Admitting: *Deleted

## 2019-11-23 NOTE — Telephone Encounter (Signed)
Pt called to get her covid-19 test result. She was notified that she is negative for the virus. She voiced understanding.

## 2019-11-27 DIAGNOSIS — K219 Gastro-esophageal reflux disease without esophagitis: Secondary | ICD-10-CM | POA: Diagnosis not present

## 2019-11-27 DIAGNOSIS — I10 Essential (primary) hypertension: Secondary | ICD-10-CM | POA: Diagnosis not present

## 2019-12-09 DIAGNOSIS — R69 Illness, unspecified: Secondary | ICD-10-CM | POA: Diagnosis not present

## 2019-12-09 DIAGNOSIS — K219 Gastro-esophageal reflux disease without esophagitis: Secondary | ICD-10-CM | POA: Diagnosis not present

## 2020-01-05 DIAGNOSIS — D638 Anemia in other chronic diseases classified elsewhere: Secondary | ICD-10-CM | POA: Diagnosis not present

## 2020-01-05 DIAGNOSIS — N184 Chronic kidney disease, stage 4 (severe): Secondary | ICD-10-CM | POA: Diagnosis not present

## 2020-01-05 DIAGNOSIS — R809 Proteinuria, unspecified: Secondary | ICD-10-CM | POA: Diagnosis not present

## 2020-01-05 DIAGNOSIS — Z716 Tobacco abuse counseling: Secondary | ICD-10-CM | POA: Diagnosis not present

## 2020-01-05 DIAGNOSIS — N170 Acute kidney failure with tubular necrosis: Secondary | ICD-10-CM | POA: Diagnosis not present

## 2020-01-09 DIAGNOSIS — N184 Chronic kidney disease, stage 4 (severe): Secondary | ICD-10-CM | POA: Diagnosis not present

## 2020-01-09 DIAGNOSIS — J410 Simple chronic bronchitis: Secondary | ICD-10-CM | POA: Diagnosis not present

## 2020-01-09 DIAGNOSIS — I10 Essential (primary) hypertension: Secondary | ICD-10-CM | POA: Diagnosis not present

## 2020-01-09 DIAGNOSIS — E785 Hyperlipidemia, unspecified: Secondary | ICD-10-CM | POA: Diagnosis not present

## 2020-02-06 DIAGNOSIS — E785 Hyperlipidemia, unspecified: Secondary | ICD-10-CM | POA: Diagnosis not present

## 2020-02-06 DIAGNOSIS — K219 Gastro-esophageal reflux disease without esophagitis: Secondary | ICD-10-CM | POA: Diagnosis not present

## 2020-02-14 DIAGNOSIS — Z23 Encounter for immunization: Secondary | ICD-10-CM | POA: Diagnosis not present

## 2020-02-22 DIAGNOSIS — Z716 Tobacco abuse counseling: Secondary | ICD-10-CM | POA: Diagnosis not present

## 2020-02-22 DIAGNOSIS — Z79899 Other long term (current) drug therapy: Secondary | ICD-10-CM | POA: Diagnosis not present

## 2020-02-22 DIAGNOSIS — N170 Acute kidney failure with tubular necrosis: Secondary | ICD-10-CM | POA: Diagnosis not present

## 2020-02-22 DIAGNOSIS — D638 Anemia in other chronic diseases classified elsewhere: Secondary | ICD-10-CM | POA: Diagnosis not present

## 2020-02-22 DIAGNOSIS — R809 Proteinuria, unspecified: Secondary | ICD-10-CM | POA: Diagnosis not present

## 2020-02-22 DIAGNOSIS — N184 Chronic kidney disease, stage 4 (severe): Secondary | ICD-10-CM | POA: Diagnosis not present

## 2020-02-29 DIAGNOSIS — N170 Acute kidney failure with tubular necrosis: Secondary | ICD-10-CM | POA: Diagnosis not present

## 2020-02-29 DIAGNOSIS — Z716 Tobacco abuse counseling: Secondary | ICD-10-CM | POA: Diagnosis not present

## 2020-02-29 DIAGNOSIS — E559 Vitamin D deficiency, unspecified: Secondary | ICD-10-CM | POA: Diagnosis not present

## 2020-02-29 DIAGNOSIS — D638 Anemia in other chronic diseases classified elsewhere: Secondary | ICD-10-CM | POA: Diagnosis not present

## 2020-02-29 DIAGNOSIS — Z79899 Other long term (current) drug therapy: Secondary | ICD-10-CM | POA: Diagnosis not present

## 2020-02-29 DIAGNOSIS — I129 Hypertensive chronic kidney disease with stage 1 through stage 4 chronic kidney disease, or unspecified chronic kidney disease: Secondary | ICD-10-CM | POA: Diagnosis not present

## 2020-02-29 DIAGNOSIS — M10472 Other secondary gout, left ankle and foot: Secondary | ICD-10-CM | POA: Diagnosis not present

## 2020-02-29 DIAGNOSIS — N1831 Chronic kidney disease, stage 3a: Secondary | ICD-10-CM | POA: Diagnosis not present

## 2020-03-02 DIAGNOSIS — Z23 Encounter for immunization: Secondary | ICD-10-CM | POA: Diagnosis not present

## 2020-03-08 DIAGNOSIS — K219 Gastro-esophageal reflux disease without esophagitis: Secondary | ICD-10-CM | POA: Diagnosis not present

## 2020-03-08 DIAGNOSIS — I10 Essential (primary) hypertension: Secondary | ICD-10-CM | POA: Diagnosis not present

## 2020-04-18 DIAGNOSIS — J42 Unspecified chronic bronchitis: Secondary | ICD-10-CM | POA: Diagnosis not present

## 2020-04-18 DIAGNOSIS — M1990 Unspecified osteoarthritis, unspecified site: Secondary | ICD-10-CM | POA: Diagnosis not present

## 2020-04-18 DIAGNOSIS — I10 Essential (primary) hypertension: Secondary | ICD-10-CM | POA: Diagnosis not present

## 2020-04-18 DIAGNOSIS — Z7982 Long term (current) use of aspirin: Secondary | ICD-10-CM | POA: Diagnosis not present

## 2020-04-18 DIAGNOSIS — K219 Gastro-esophageal reflux disease without esophagitis: Secondary | ICD-10-CM | POA: Diagnosis not present

## 2020-04-18 DIAGNOSIS — E785 Hyperlipidemia, unspecified: Secondary | ICD-10-CM | POA: Diagnosis not present

## 2020-04-18 DIAGNOSIS — R69 Illness, unspecified: Secondary | ICD-10-CM | POA: Diagnosis not present

## 2020-04-18 DIAGNOSIS — Z008 Encounter for other general examination: Secondary | ICD-10-CM | POA: Diagnosis not present

## 2020-04-18 DIAGNOSIS — M109 Gout, unspecified: Secondary | ICD-10-CM | POA: Diagnosis not present

## 2020-04-18 DIAGNOSIS — Z8249 Family history of ischemic heart disease and other diseases of the circulatory system: Secondary | ICD-10-CM | POA: Diagnosis not present

## 2020-04-20 DIAGNOSIS — I10 Essential (primary) hypertension: Secondary | ICD-10-CM | POA: Diagnosis not present

## 2020-04-20 DIAGNOSIS — N184 Chronic kidney disease, stage 4 (severe): Secondary | ICD-10-CM | POA: Diagnosis not present

## 2020-04-20 DIAGNOSIS — E46 Unspecified protein-calorie malnutrition: Secondary | ICD-10-CM | POA: Diagnosis not present

## 2020-04-20 DIAGNOSIS — J449 Chronic obstructive pulmonary disease, unspecified: Secondary | ICD-10-CM | POA: Diagnosis not present

## 2020-04-20 DIAGNOSIS — R69 Illness, unspecified: Secondary | ICD-10-CM | POA: Diagnosis not present

## 2020-05-21 DIAGNOSIS — K219 Gastro-esophageal reflux disease without esophagitis: Secondary | ICD-10-CM | POA: Diagnosis not present

## 2020-05-21 DIAGNOSIS — N184 Chronic kidney disease, stage 4 (severe): Secondary | ICD-10-CM | POA: Diagnosis not present

## 2020-06-19 DIAGNOSIS — I129 Hypertensive chronic kidney disease with stage 1 through stage 4 chronic kidney disease, or unspecified chronic kidney disease: Secondary | ICD-10-CM | POA: Diagnosis not present

## 2020-06-19 DIAGNOSIS — E559 Vitamin D deficiency, unspecified: Secondary | ICD-10-CM | POA: Diagnosis not present

## 2020-06-19 DIAGNOSIS — D638 Anemia in other chronic diseases classified elsewhere: Secondary | ICD-10-CM | POA: Diagnosis not present

## 2020-06-19 DIAGNOSIS — N170 Acute kidney failure with tubular necrosis: Secondary | ICD-10-CM | POA: Diagnosis not present

## 2020-06-19 DIAGNOSIS — M10472 Other secondary gout, left ankle and foot: Secondary | ICD-10-CM | POA: Diagnosis not present

## 2020-06-19 DIAGNOSIS — Z716 Tobacco abuse counseling: Secondary | ICD-10-CM | POA: Diagnosis not present

## 2020-06-19 DIAGNOSIS — Z79899 Other long term (current) drug therapy: Secondary | ICD-10-CM | POA: Diagnosis not present

## 2020-06-19 DIAGNOSIS — N1831 Chronic kidney disease, stage 3a: Secondary | ICD-10-CM | POA: Diagnosis not present

## 2020-06-20 DIAGNOSIS — K219 Gastro-esophageal reflux disease without esophagitis: Secondary | ICD-10-CM | POA: Diagnosis not present

## 2020-06-20 DIAGNOSIS — E785 Hyperlipidemia, unspecified: Secondary | ICD-10-CM | POA: Diagnosis not present

## 2020-06-27 DIAGNOSIS — I129 Hypertensive chronic kidney disease with stage 1 through stage 4 chronic kidney disease, or unspecified chronic kidney disease: Secondary | ICD-10-CM | POA: Diagnosis not present

## 2020-06-27 DIAGNOSIS — R718 Other abnormality of red blood cells: Secondary | ICD-10-CM | POA: Diagnosis not present

## 2020-06-27 DIAGNOSIS — E538 Deficiency of other specified B group vitamins: Secondary | ICD-10-CM | POA: Diagnosis not present

## 2020-06-27 DIAGNOSIS — E211 Secondary hyperparathyroidism, not elsewhere classified: Secondary | ICD-10-CM | POA: Diagnosis not present

## 2020-06-27 DIAGNOSIS — N1831 Chronic kidney disease, stage 3a: Secondary | ICD-10-CM | POA: Diagnosis not present

## 2020-06-27 DIAGNOSIS — M10071 Idiopathic gout, right ankle and foot: Secondary | ICD-10-CM | POA: Diagnosis not present

## 2020-07-05 DIAGNOSIS — H40013 Open angle with borderline findings, low risk, bilateral: Secondary | ICD-10-CM | POA: Diagnosis not present

## 2020-07-05 DIAGNOSIS — Z0100 Encounter for examination of eyes and vision without abnormal findings: Secondary | ICD-10-CM | POA: Diagnosis not present

## 2020-07-05 DIAGNOSIS — H5200 Hypermetropia, unspecified eye: Secondary | ICD-10-CM | POA: Diagnosis not present

## 2020-07-05 DIAGNOSIS — H2513 Age-related nuclear cataract, bilateral: Secondary | ICD-10-CM | POA: Diagnosis not present

## 2020-07-26 DIAGNOSIS — K219 Gastro-esophageal reflux disease without esophagitis: Secondary | ICD-10-CM | POA: Diagnosis not present

## 2020-07-26 DIAGNOSIS — E785 Hyperlipidemia, unspecified: Secondary | ICD-10-CM | POA: Diagnosis not present

## 2020-08-23 ENCOUNTER — Other Ambulatory Visit (HOSPITAL_COMMUNITY): Payer: Self-pay | Admitting: Internal Medicine

## 2020-08-23 DIAGNOSIS — I10 Essential (primary) hypertension: Secondary | ICD-10-CM | POA: Diagnosis not present

## 2020-08-23 DIAGNOSIS — K219 Gastro-esophageal reflux disease without esophagitis: Secondary | ICD-10-CM | POA: Diagnosis not present

## 2020-08-23 DIAGNOSIS — N184 Chronic kidney disease, stage 4 (severe): Secondary | ICD-10-CM | POA: Diagnosis not present

## 2020-08-23 DIAGNOSIS — R69 Illness, unspecified: Secondary | ICD-10-CM | POA: Diagnosis not present

## 2020-08-23 DIAGNOSIS — M109 Gout, unspecified: Secondary | ICD-10-CM | POA: Diagnosis not present

## 2020-08-23 DIAGNOSIS — Z1231 Encounter for screening mammogram for malignant neoplasm of breast: Secondary | ICD-10-CM

## 2020-08-23 DIAGNOSIS — Z1331 Encounter for screening for depression: Secondary | ICD-10-CM | POA: Diagnosis not present

## 2020-08-23 DIAGNOSIS — Z1389 Encounter for screening for other disorder: Secondary | ICD-10-CM | POA: Diagnosis not present

## 2020-08-24 ENCOUNTER — Other Ambulatory Visit: Payer: Self-pay | Admitting: Internal Medicine

## 2020-08-24 ENCOUNTER — Other Ambulatory Visit (HOSPITAL_COMMUNITY): Payer: Self-pay | Admitting: Internal Medicine

## 2020-08-24 ENCOUNTER — Other Ambulatory Visit: Payer: Self-pay | Admitting: Otolaryngology

## 2020-08-24 DIAGNOSIS — F17200 Nicotine dependence, unspecified, uncomplicated: Secondary | ICD-10-CM

## 2020-08-24 DIAGNOSIS — M79605 Pain in left leg: Secondary | ICD-10-CM

## 2020-08-24 DIAGNOSIS — R918 Other nonspecific abnormal finding of lung field: Secondary | ICD-10-CM

## 2020-08-24 DIAGNOSIS — M79604 Pain in right leg: Secondary | ICD-10-CM

## 2020-08-29 ENCOUNTER — Other Ambulatory Visit: Payer: Self-pay

## 2020-08-29 ENCOUNTER — Ambulatory Visit (HOSPITAL_COMMUNITY)
Admission: RE | Admit: 2020-08-29 | Discharge: 2020-08-29 | Disposition: A | Discharge status: Home / Self Care | Payer: Medicare HMO | Source: Ambulatory Visit | Attending: Internal Medicine | Admitting: Internal Medicine

## 2020-08-29 DIAGNOSIS — M79604 Pain in right leg: Secondary | ICD-10-CM | POA: Insufficient documentation

## 2020-08-29 DIAGNOSIS — I739 Peripheral vascular disease, unspecified: Secondary | ICD-10-CM | POA: Diagnosis not present

## 2020-08-29 DIAGNOSIS — M79605 Pain in left leg: Secondary | ICD-10-CM | POA: Diagnosis not present

## 2020-08-31 DIAGNOSIS — E211 Secondary hyperparathyroidism, not elsewhere classified: Secondary | ICD-10-CM | POA: Diagnosis not present

## 2020-08-31 DIAGNOSIS — Z5181 Encounter for therapeutic drug level monitoring: Secondary | ICD-10-CM | POA: Diagnosis not present

## 2020-08-31 DIAGNOSIS — I129 Hypertensive chronic kidney disease with stage 1 through stage 4 chronic kidney disease, or unspecified chronic kidney disease: Secondary | ICD-10-CM | POA: Diagnosis not present

## 2020-08-31 DIAGNOSIS — N1831 Chronic kidney disease, stage 3a: Secondary | ICD-10-CM | POA: Diagnosis not present

## 2020-08-31 DIAGNOSIS — R718 Other abnormality of red blood cells: Secondary | ICD-10-CM | POA: Diagnosis not present

## 2020-08-31 DIAGNOSIS — E538 Deficiency of other specified B group vitamins: Secondary | ICD-10-CM | POA: Diagnosis not present

## 2020-09-13 DIAGNOSIS — E559 Vitamin D deficiency, unspecified: Secondary | ICD-10-CM | POA: Diagnosis not present

## 2020-09-13 DIAGNOSIS — Z716 Tobacco abuse counseling: Secondary | ICD-10-CM | POA: Diagnosis not present

## 2020-09-13 DIAGNOSIS — N182 Chronic kidney disease, stage 2 (mild): Secondary | ICD-10-CM | POA: Diagnosis not present

## 2020-09-13 DIAGNOSIS — R718 Other abnormality of red blood cells: Secondary | ICD-10-CM | POA: Diagnosis not present

## 2020-09-17 ENCOUNTER — Encounter (HOSPITAL_COMMUNITY): Payer: Self-pay

## 2020-09-17 ENCOUNTER — Ambulatory Visit (HOSPITAL_COMMUNITY): Payer: Medicare HMO

## 2020-09-22 DIAGNOSIS — K219 Gastro-esophageal reflux disease without esophagitis: Secondary | ICD-10-CM | POA: Diagnosis not present

## 2020-09-22 DIAGNOSIS — I10 Essential (primary) hypertension: Secondary | ICD-10-CM | POA: Diagnosis not present

## 2020-09-26 DIAGNOSIS — Z23 Encounter for immunization: Secondary | ICD-10-CM | POA: Diagnosis not present

## 2020-10-15 ENCOUNTER — Other Ambulatory Visit: Payer: Self-pay

## 2020-10-15 ENCOUNTER — Ambulatory Visit (HOSPITAL_COMMUNITY)
Admission: RE | Admit: 2020-10-15 | Discharge: 2020-10-15 | Disposition: A | Discharge status: Home / Self Care | Payer: Medicare HMO | Source: Ambulatory Visit | Attending: Internal Medicine | Admitting: Internal Medicine

## 2020-10-15 DIAGNOSIS — F17200 Nicotine dependence, unspecified, uncomplicated: Secondary | ICD-10-CM | POA: Diagnosis present

## 2020-10-15 DIAGNOSIS — Z1231 Encounter for screening mammogram for malignant neoplasm of breast: Secondary | ICD-10-CM | POA: Insufficient documentation

## 2020-10-15 DIAGNOSIS — R918 Other nonspecific abnormal finding of lung field: Secondary | ICD-10-CM | POA: Diagnosis not present

## 2020-10-15 DIAGNOSIS — R69 Illness, unspecified: Secondary | ICD-10-CM | POA: Diagnosis not present

## 2020-10-16 ENCOUNTER — Ambulatory Visit (HOSPITAL_COMMUNITY)
Admission: RE | Admit: 2020-10-16 | Discharge: 2020-10-16 | Disposition: A | Discharge status: Home / Self Care | Payer: Medicare HMO | Source: Ambulatory Visit | Attending: Internal Medicine | Admitting: Internal Medicine

## 2020-10-16 ENCOUNTER — Other Ambulatory Visit: Payer: Self-pay

## 2020-10-16 ENCOUNTER — Encounter (HOSPITAL_COMMUNITY): Payer: Self-pay

## 2020-10-16 DIAGNOSIS — F17200 Nicotine dependence, unspecified, uncomplicated: Secondary | ICD-10-CM

## 2020-10-16 DIAGNOSIS — R918 Other nonspecific abnormal finding of lung field: Secondary | ICD-10-CM

## 2020-10-23 DIAGNOSIS — I10 Essential (primary) hypertension: Secondary | ICD-10-CM | POA: Diagnosis not present

## 2020-10-23 DIAGNOSIS — K219 Gastro-esophageal reflux disease without esophagitis: Secondary | ICD-10-CM | POA: Diagnosis not present

## 2020-10-29 ENCOUNTER — Other Ambulatory Visit (HOSPITAL_COMMUNITY): Payer: Self-pay | Admitting: Internal Medicine

## 2020-10-29 DIAGNOSIS — R928 Other abnormal and inconclusive findings on diagnostic imaging of breast: Secondary | ICD-10-CM

## 2020-10-30 ENCOUNTER — Ambulatory Visit (HOSPITAL_COMMUNITY)
Admission: RE | Admit: 2020-10-30 | Discharge: 2020-10-30 | Disposition: A | Discharge status: Home / Self Care | Payer: Medicare HMO | Source: Ambulatory Visit | Attending: Internal Medicine | Admitting: Internal Medicine

## 2020-10-30 ENCOUNTER — Other Ambulatory Visit: Payer: Self-pay

## 2020-10-30 DIAGNOSIS — R928 Other abnormal and inconclusive findings on diagnostic imaging of breast: Secondary | ICD-10-CM | POA: Diagnosis not present

## 2020-10-30 DIAGNOSIS — R921 Mammographic calcification found on diagnostic imaging of breast: Secondary | ICD-10-CM | POA: Diagnosis not present

## 2020-11-07 ENCOUNTER — Other Ambulatory Visit: Payer: Self-pay | Admitting: Internal Medicine

## 2020-11-07 DIAGNOSIS — R928 Other abnormal and inconclusive findings on diagnostic imaging of breast: Secondary | ICD-10-CM

## 2020-11-09 ENCOUNTER — Other Ambulatory Visit: Payer: Self-pay

## 2020-11-09 ENCOUNTER — Ambulatory Visit (HOSPITAL_COMMUNITY)
Admission: RE | Admit: 2020-11-09 | Discharge: 2020-11-09 | Disposition: A | Discharge status: Home / Self Care | Payer: Medicare HMO | Source: Ambulatory Visit | Attending: Internal Medicine | Admitting: Internal Medicine

## 2020-11-09 DIAGNOSIS — F17210 Nicotine dependence, cigarettes, uncomplicated: Secondary | ICD-10-CM | POA: Insufficient documentation

## 2020-11-09 DIAGNOSIS — Z122 Encounter for screening for malignant neoplasm of respiratory organs: Secondary | ICD-10-CM | POA: Diagnosis not present

## 2020-11-09 DIAGNOSIS — R918 Other nonspecific abnormal finding of lung field: Secondary | ICD-10-CM | POA: Insufficient documentation

## 2020-11-09 DIAGNOSIS — I700 Atherosclerosis of aorta: Secondary | ICD-10-CM | POA: Diagnosis not present

## 2020-11-09 DIAGNOSIS — R69 Illness, unspecified: Secondary | ICD-10-CM | POA: Diagnosis not present

## 2020-11-09 DIAGNOSIS — J439 Emphysema, unspecified: Secondary | ICD-10-CM | POA: Diagnosis not present

## 2020-11-19 ENCOUNTER — Ambulatory Visit
Admission: RE | Admit: 2020-11-19 | Discharge: 2020-11-19 | Disposition: A | Discharge status: Home / Self Care | Payer: Medicare HMO | Source: Ambulatory Visit | Attending: Internal Medicine | Admitting: Internal Medicine

## 2020-11-19 ENCOUNTER — Other Ambulatory Visit: Payer: Self-pay

## 2020-11-19 DIAGNOSIS — R928 Other abnormal and inconclusive findings on diagnostic imaging of breast: Secondary | ICD-10-CM

## 2020-11-19 DIAGNOSIS — N6489 Other specified disorders of breast: Secondary | ICD-10-CM | POA: Diagnosis not present

## 2020-11-19 DIAGNOSIS — R921 Mammographic calcification found on diagnostic imaging of breast: Secondary | ICD-10-CM | POA: Diagnosis not present

## 2020-11-19 HISTORY — PX: BREAST BIOPSY: SHX20

## 2020-11-22 DIAGNOSIS — I10 Essential (primary) hypertension: Secondary | ICD-10-CM | POA: Diagnosis not present

## 2020-11-22 DIAGNOSIS — K219 Gastro-esophageal reflux disease without esophagitis: Secondary | ICD-10-CM | POA: Diagnosis not present

## 2020-12-27 DIAGNOSIS — N184 Chronic kidney disease, stage 4 (severe): Secondary | ICD-10-CM | POA: Diagnosis not present

## 2020-12-27 DIAGNOSIS — I10 Essential (primary) hypertension: Secondary | ICD-10-CM | POA: Diagnosis not present

## 2021-01-08 DIAGNOSIS — R718 Other abnormality of red blood cells: Secondary | ICD-10-CM | POA: Diagnosis not present

## 2021-01-08 DIAGNOSIS — E559 Vitamin D deficiency, unspecified: Secondary | ICD-10-CM | POA: Diagnosis not present

## 2021-01-08 DIAGNOSIS — N182 Chronic kidney disease, stage 2 (mild): Secondary | ICD-10-CM | POA: Diagnosis not present

## 2021-01-08 DIAGNOSIS — Z716 Tobacco abuse counseling: Secondary | ICD-10-CM | POA: Diagnosis not present

## 2021-01-08 LAB — COMPREHENSIVE METABOLIC PANEL: Calcium: 11 — AB (ref 8.7–10.7)

## 2021-01-18 DIAGNOSIS — R718 Other abnormality of red blood cells: Secondary | ICD-10-CM | POA: Diagnosis not present

## 2021-01-18 DIAGNOSIS — I129 Hypertensive chronic kidney disease with stage 1 through stage 4 chronic kidney disease, or unspecified chronic kidney disease: Secondary | ICD-10-CM | POA: Diagnosis not present

## 2021-01-18 DIAGNOSIS — Z716 Tobacco abuse counseling: Secondary | ICD-10-CM | POA: Diagnosis not present

## 2021-01-18 DIAGNOSIS — N182 Chronic kidney disease, stage 2 (mild): Secondary | ICD-10-CM | POA: Diagnosis not present

## 2021-01-27 DIAGNOSIS — R69 Illness, unspecified: Secondary | ICD-10-CM | POA: Diagnosis not present

## 2021-01-27 DIAGNOSIS — I10 Essential (primary) hypertension: Secondary | ICD-10-CM | POA: Diagnosis not present

## 2021-03-04 DIAGNOSIS — K219 Gastro-esophageal reflux disease without esophagitis: Secondary | ICD-10-CM | POA: Diagnosis not present

## 2021-03-04 DIAGNOSIS — I10 Essential (primary) hypertension: Secondary | ICD-10-CM | POA: Diagnosis not present

## 2021-03-04 DIAGNOSIS — I700 Atherosclerosis of aorta: Secondary | ICD-10-CM | POA: Diagnosis not present

## 2021-03-04 DIAGNOSIS — N184 Chronic kidney disease, stage 4 (severe): Secondary | ICD-10-CM | POA: Diagnosis not present

## 2021-03-25 DIAGNOSIS — N182 Chronic kidney disease, stage 2 (mild): Secondary | ICD-10-CM | POA: Diagnosis not present

## 2021-03-25 DIAGNOSIS — R718 Other abnormality of red blood cells: Secondary | ICD-10-CM | POA: Diagnosis not present

## 2021-03-25 DIAGNOSIS — Z716 Tobacco abuse counseling: Secondary | ICD-10-CM | POA: Diagnosis not present

## 2021-03-25 DIAGNOSIS — I129 Hypertensive chronic kidney disease with stage 1 through stage 4 chronic kidney disease, or unspecified chronic kidney disease: Secondary | ICD-10-CM | POA: Diagnosis not present

## 2021-03-25 LAB — VITAMIN D 25 HYDROXY (VIT D DEFICIENCY, FRACTURES): Vit D, 25-Hydroxy: 19

## 2021-03-29 DIAGNOSIS — N182 Chronic kidney disease, stage 2 (mild): Secondary | ICD-10-CM | POA: Diagnosis not present

## 2021-03-29 DIAGNOSIS — I129 Hypertensive chronic kidney disease with stage 1 through stage 4 chronic kidney disease, or unspecified chronic kidney disease: Secondary | ICD-10-CM | POA: Diagnosis not present

## 2021-03-29 DIAGNOSIS — E559 Vitamin D deficiency, unspecified: Secondary | ICD-10-CM | POA: Diagnosis not present

## 2021-04-04 DIAGNOSIS — I10 Essential (primary) hypertension: Secondary | ICD-10-CM | POA: Diagnosis not present

## 2021-04-04 DIAGNOSIS — K219 Gastro-esophageal reflux disease without esophagitis: Secondary | ICD-10-CM | POA: Diagnosis not present

## 2021-04-05 IMAGING — CT CT CHEST LUNG CANCER SCREENING LOW DOSE W/O CM
1 of 2 series · 10 of 20 positions shown, 13 images · non-contrast
Comparison: Low-dose lung cancer screening CT chest dated
07/25/2019

CLINICAL DATA: 69-year-old female current smoker, with 31 pack-year
history of smoking, for follow-up lung cancer screening

EXAM:
CT CHEST WITHOUT CONTRAST LOW-DOSE FOR LUNG CANCER SCREENING
TECHNIQUE: Multidetector CT imaging of the chest was performed following the
standard protocol without IV contrast.

[ct lung segmentation data · axial · 0.60mm/px · z∈[+1211,+1211]mm · 10 of 271 frames shown]
[frame 1/271  mediastinal]
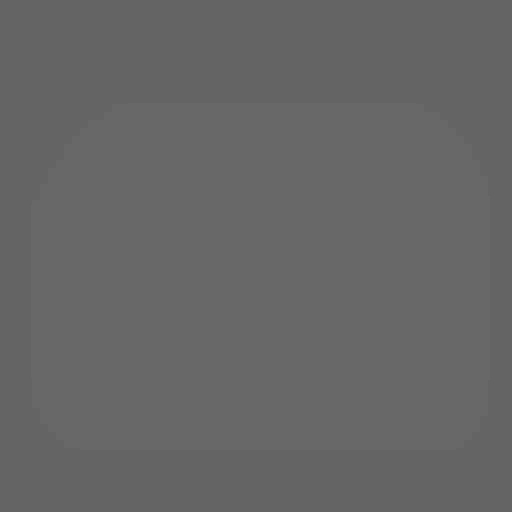
[frame 1/271  lung]
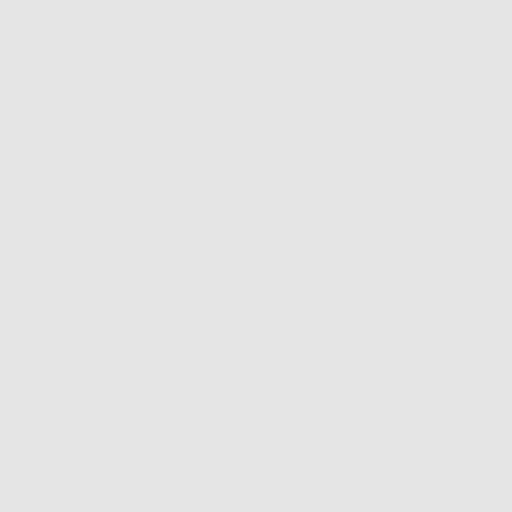
[frame 31/271  lung]
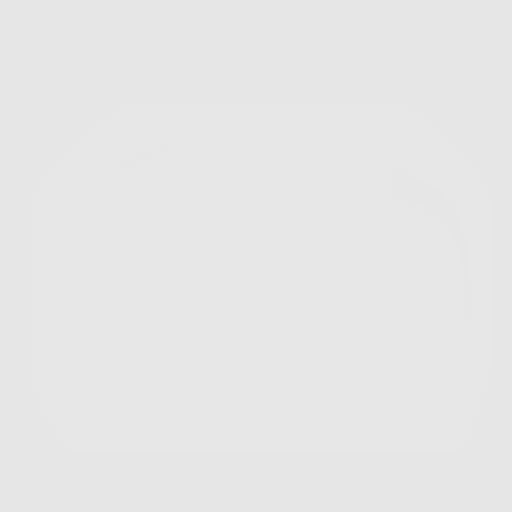
[frame 61/271  lung]
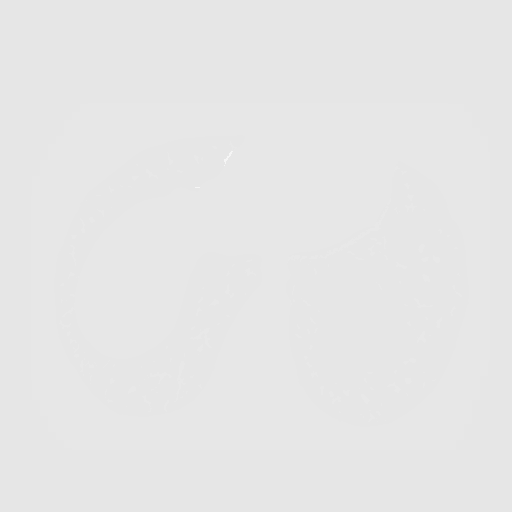
[frame 91/271  lung]
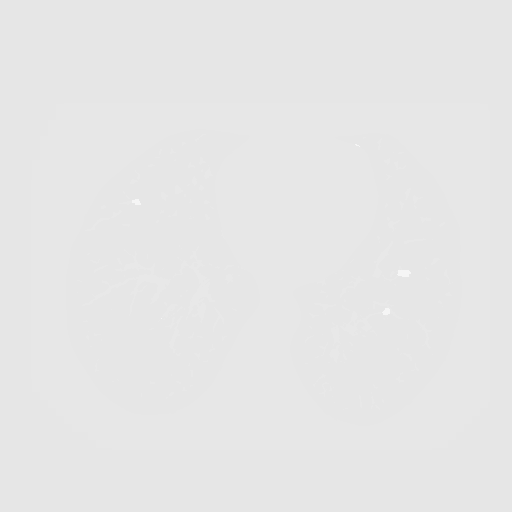
[frame 121/271  mediastinal]
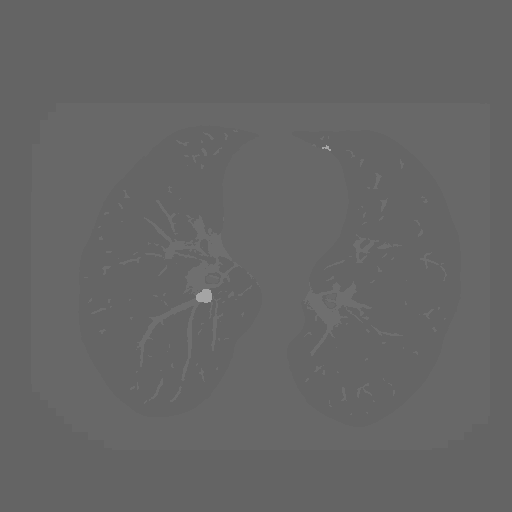
[frame 121/271  lung]
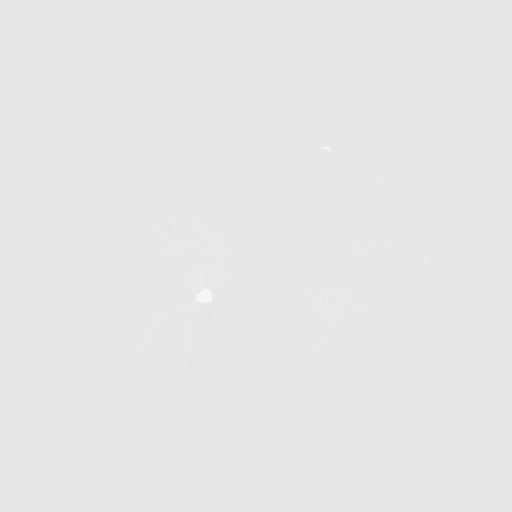
[frame 151/271  lung]
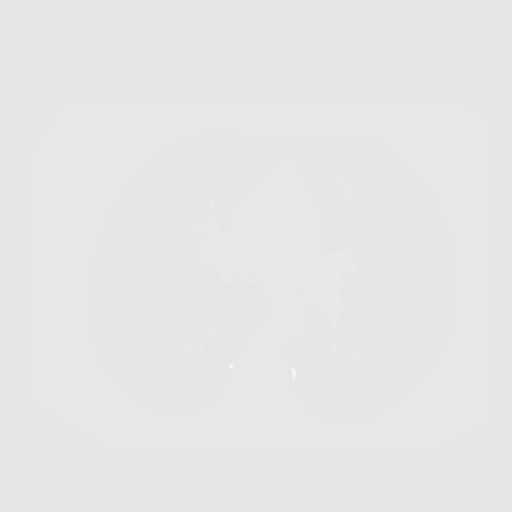
[frame 181/271  lung]
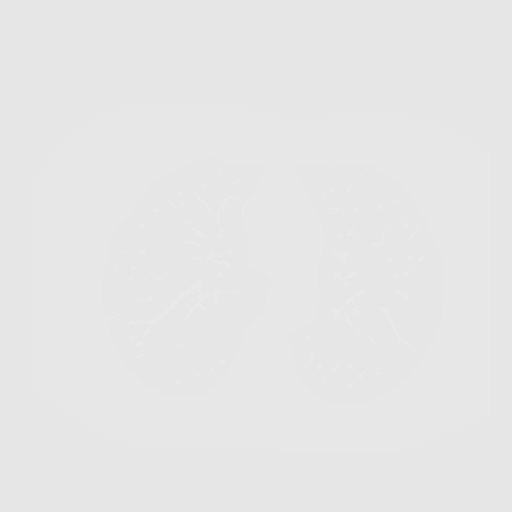
[frame 211/271  lung]
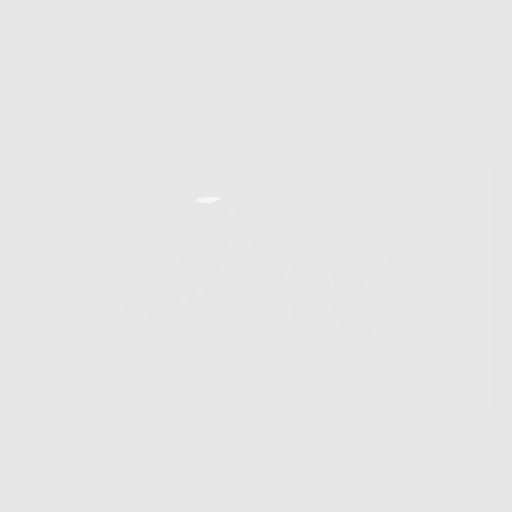
[frame 241/271  mediastinal]
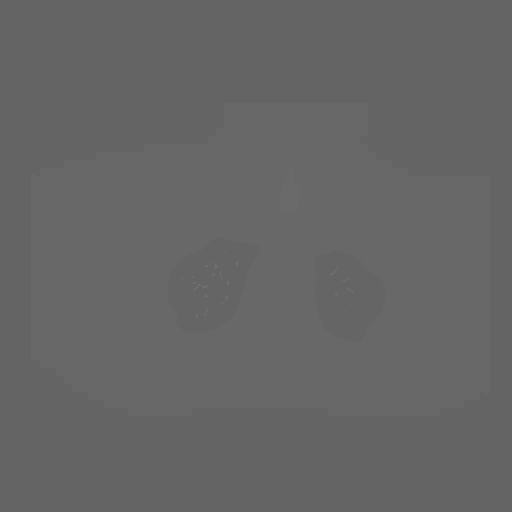
[frame 241/271  lung]
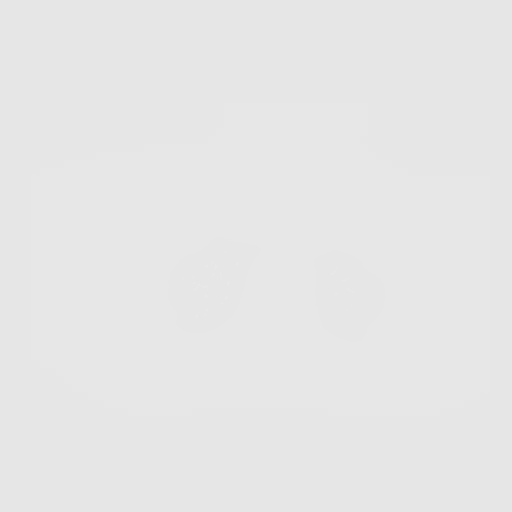
[frame 271/271  lung]
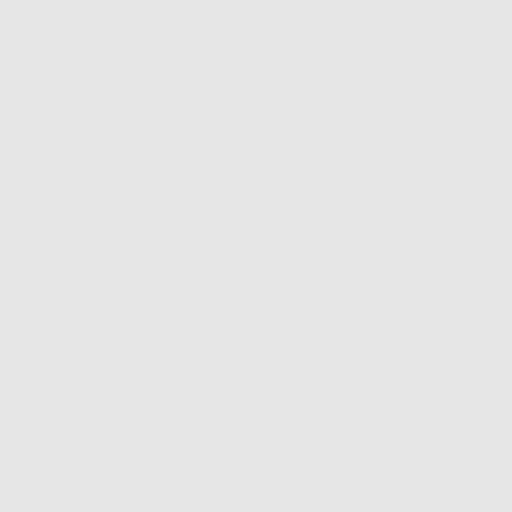

[10 of 20 positions shown; findings below may reference images not displayed]

FINDINGS: Cardiovascular: The heart is normal in size. No pericardial
effusion.

No evidence of thoracic aortic aneurysm. Atherosclerotic
calcifications of the aortic arch.

Three vessel coronary atherosclerosis.

Mediastinum/Nodes: No suspicious mediastinal lymphadenopathy.

Visualized thyroid is unremarkable.

Lungs/Pleura: Evaluation lung parenchyma is constrained by
respiratory motion.

Mild to moderate centrilobular emphysematous changes, upper lung
predominant.

No focal consolidation.

1.7 mm subpleural nodule in the right lung apex. Additional central
right lower lobe nodule is not well visualized, likely motion
degraded.

No pleural effusion or pneumothorax.

Upper Abdomen: Visualized upper abdomen is notable for vascular
calcifications.

Musculoskeletal: Mild degenerative changes of the visualized
thoracolumbar spine.
IMPRESSION: Lung-RADS 2, benign appearance or behavior.

Aortic Atherosclerosis (798MU-862.2) and Emphysema (798MU-[id number]).

## 2021-05-01 ENCOUNTER — Other Ambulatory Visit (HOSPITAL_COMMUNITY): Payer: Self-pay | Admitting: Nephrology

## 2021-05-04 DIAGNOSIS — I10 Essential (primary) hypertension: Secondary | ICD-10-CM | POA: Diagnosis not present

## 2021-05-04 DIAGNOSIS — N184 Chronic kidney disease, stage 4 (severe): Secondary | ICD-10-CM | POA: Diagnosis not present

## 2021-05-04 DIAGNOSIS — E785 Hyperlipidemia, unspecified: Secondary | ICD-10-CM | POA: Diagnosis not present

## 2021-05-07 ENCOUNTER — Other Ambulatory Visit: Payer: Self-pay

## 2021-05-07 ENCOUNTER — Encounter (HOSPITAL_COMMUNITY)
Admission: RE | Admit: 2021-05-07 | Discharge: 2021-05-07 | Disposition: A | Discharge status: Home / Self Care | Payer: Medicare HMO | Source: Ambulatory Visit | Attending: Nephrology | Admitting: Nephrology

## 2021-05-07 DIAGNOSIS — D351 Benign neoplasm of parathyroid gland: Secondary | ICD-10-CM | POA: Diagnosis not present

## 2021-05-07 MED ORDER — TECHNETIUM TC 99M SESTAMIBI GENERIC - CARDIOLITE
25.0000 | Freq: Once | INTRAVENOUS | Status: AC | PRN
  Administered 2021-05-07: 24.4 via INTRAVENOUS

## 2021-06-04 DIAGNOSIS — R69 Illness, unspecified: Secondary | ICD-10-CM | POA: Diagnosis not present

## 2021-06-04 DIAGNOSIS — I10 Essential (primary) hypertension: Secondary | ICD-10-CM | POA: Diagnosis not present

## 2021-06-13 LAB — COMPREHENSIVE METABOLIC PANEL: Calcium: 10.6 (ref 8.7–10.7)

## 2021-06-13 LAB — BASIC METABOLIC PANEL
BUN: 13 (ref 4–21)
Creatinine: 0.7 (ref 0.5–1.1)

## 2021-06-24 DIAGNOSIS — N182 Chronic kidney disease, stage 2 (mild): Secondary | ICD-10-CM | POA: Diagnosis not present

## 2021-06-24 DIAGNOSIS — I129 Hypertensive chronic kidney disease with stage 1 through stage 4 chronic kidney disease, or unspecified chronic kidney disease: Secondary | ICD-10-CM | POA: Diagnosis not present

## 2021-06-24 DIAGNOSIS — E559 Vitamin D deficiency, unspecified: Secondary | ICD-10-CM | POA: Diagnosis not present

## 2021-06-28 DIAGNOSIS — E559 Vitamin D deficiency, unspecified: Secondary | ICD-10-CM | POA: Diagnosis not present

## 2021-06-28 DIAGNOSIS — E210 Primary hyperparathyroidism: Secondary | ICD-10-CM | POA: Diagnosis not present

## 2021-06-28 DIAGNOSIS — I129 Hypertensive chronic kidney disease with stage 1 through stage 4 chronic kidney disease, or unspecified chronic kidney disease: Secondary | ICD-10-CM | POA: Diagnosis not present

## 2021-06-28 DIAGNOSIS — N182 Chronic kidney disease, stage 2 (mild): Secondary | ICD-10-CM | POA: Diagnosis not present

## 2021-06-28 DIAGNOSIS — R809 Proteinuria, unspecified: Secondary | ICD-10-CM | POA: Diagnosis not present

## 2021-07-04 DIAGNOSIS — I10 Essential (primary) hypertension: Secondary | ICD-10-CM | POA: Diagnosis not present

## 2021-07-04 DIAGNOSIS — K219 Gastro-esophageal reflux disease without esophagitis: Secondary | ICD-10-CM | POA: Diagnosis not present

## 2021-07-15 DIAGNOSIS — Z7982 Long term (current) use of aspirin: Secondary | ICD-10-CM | POA: Diagnosis not present

## 2021-07-15 DIAGNOSIS — N189 Chronic kidney disease, unspecified: Secondary | ICD-10-CM | POA: Diagnosis not present

## 2021-07-15 DIAGNOSIS — I739 Peripheral vascular disease, unspecified: Secondary | ICD-10-CM | POA: Diagnosis not present

## 2021-07-15 DIAGNOSIS — R69 Illness, unspecified: Secondary | ICD-10-CM | POA: Diagnosis not present

## 2021-07-15 DIAGNOSIS — M1990 Unspecified osteoarthritis, unspecified site: Secondary | ICD-10-CM | POA: Diagnosis not present

## 2021-07-15 DIAGNOSIS — E785 Hyperlipidemia, unspecified: Secondary | ICD-10-CM | POA: Diagnosis not present

## 2021-07-15 DIAGNOSIS — J449 Chronic obstructive pulmonary disease, unspecified: Secondary | ICD-10-CM | POA: Diagnosis not present

## 2021-07-15 DIAGNOSIS — M109 Gout, unspecified: Secondary | ICD-10-CM | POA: Diagnosis not present

## 2021-07-15 DIAGNOSIS — Z823 Family history of stroke: Secondary | ICD-10-CM | POA: Diagnosis not present

## 2021-07-15 DIAGNOSIS — I129 Hypertensive chronic kidney disease with stage 1 through stage 4 chronic kidney disease, or unspecified chronic kidney disease: Secondary | ICD-10-CM | POA: Diagnosis not present

## 2021-08-04 DIAGNOSIS — R69 Illness, unspecified: Secondary | ICD-10-CM | POA: Diagnosis not present

## 2021-08-04 DIAGNOSIS — I10 Essential (primary) hypertension: Secondary | ICD-10-CM | POA: Diagnosis not present

## 2021-08-09 ENCOUNTER — Other Ambulatory Visit (HOSPITAL_COMMUNITY): Payer: Self-pay | Admitting: Internal Medicine

## 2021-08-09 DIAGNOSIS — Z0001 Encounter for general adult medical examination with abnormal findings: Secondary | ICD-10-CM | POA: Diagnosis not present

## 2021-08-09 DIAGNOSIS — Z1389 Encounter for screening for other disorder: Secondary | ICD-10-CM | POA: Diagnosis not present

## 2021-08-09 DIAGNOSIS — F17210 Nicotine dependence, cigarettes, uncomplicated: Secondary | ICD-10-CM | POA: Diagnosis not present

## 2021-08-09 DIAGNOSIS — Z1331 Encounter for screening for depression: Secondary | ICD-10-CM | POA: Diagnosis not present

## 2021-08-09 DIAGNOSIS — R69 Illness, unspecified: Secondary | ICD-10-CM | POA: Diagnosis not present

## 2021-08-09 DIAGNOSIS — I10 Essential (primary) hypertension: Secondary | ICD-10-CM | POA: Diagnosis not present

## 2021-08-09 DIAGNOSIS — Z1231 Encounter for screening mammogram for malignant neoplasm of breast: Secondary | ICD-10-CM

## 2021-08-09 DIAGNOSIS — I700 Atherosclerosis of aorta: Secondary | ICD-10-CM | POA: Diagnosis not present

## 2021-08-09 DIAGNOSIS — J449 Chronic obstructive pulmonary disease, unspecified: Secondary | ICD-10-CM | POA: Diagnosis not present

## 2021-08-15 ENCOUNTER — Other Ambulatory Visit (HOSPITAL_COMMUNITY): Payer: Self-pay | Admitting: Gerontology

## 2021-08-15 ENCOUNTER — Other Ambulatory Visit: Payer: Self-pay | Admitting: Gerontology

## 2021-08-15 DIAGNOSIS — Z87891 Personal history of nicotine dependence: Secondary | ICD-10-CM

## 2021-09-02 ENCOUNTER — Ambulatory Visit: Payer: Medicare HMO | Admitting: "Endocrinology

## 2021-09-02 ENCOUNTER — Encounter: Payer: Self-pay | Admitting: "Endocrinology

## 2021-09-02 DIAGNOSIS — E210 Primary hyperparathyroidism: Secondary | ICD-10-CM

## 2021-09-02 DIAGNOSIS — N182 Chronic kidney disease, stage 2 (mild): Secondary | ICD-10-CM | POA: Diagnosis not present

## 2021-09-02 DIAGNOSIS — E559 Vitamin D deficiency, unspecified: Secondary | ICD-10-CM

## 2021-09-02 DIAGNOSIS — R809 Proteinuria, unspecified: Secondary | ICD-10-CM | POA: Diagnosis not present

## 2021-09-02 DIAGNOSIS — I129 Hypertensive chronic kidney disease with stage 1 through stage 4 chronic kidney disease, or unspecified chronic kidney disease: Secondary | ICD-10-CM | POA: Diagnosis not present

## 2021-09-02 MED ORDER — VITAMIN D3 125 MCG (5000 UT) PO CAPS: 5000.0000 [IU] | ORAL_CAPSULE | Freq: Every day | ORAL | 0 refills | Status: DC

## 2021-09-02 NOTE — Progress Notes (Signed)
Endocrinology Consult Note       09/02/2021, 4:36 PM  Stephanie [name] is a 70 y.o.-year-old female, referred by her  Rosita Fire, MD  , for evaluation for hypercalcemia/hyperparathyroidism.   Past Medical History:  Diagnosis Date   GERD (gastroesophageal reflux disease)    HTN (hypertension)    Hyperlipidemia    Hyperparathyroidism ([place])    Vitamin D deficiency     Past Surgical History:  Procedure Laterality Date   APPENDECTOMY     COLONOSCOPY  09/03/2006   IRC:VELF diverticulosis in the sigmoid colon.  Otherwise normal colon   COLONOSCOPY N/A 05/23/2016   Procedure: COLONOSCOPY;  Surgeon: Danie Binder, MD;  [facility];  Service: Endoscopy;  Laterality: N/A;  1000    Social History   Tobacco Use   Smoking status: Every Day    Packs/day: 1.00    Years: 20.00    Pack years: 20.00    Types: Cigarettes   Smokeless tobacco: Never  Substance Use Topics   Alcohol use: Yes    Alcohol/week: 12.0 standard drinks    Types: 6 Cans of beer, 6 Standard drinks or equivalent per week    Comment: 6 pack in a week   Drug use: Yes    Types: Marijuana    Family History  Problem Relation Age of Onset   Hypertension Mother    Hyperlipidemia Mother    Hypertension Father    Colon cancer Neg Hx    Colon polyps Neg Hx    Liver disease Neg Hx     Outpatient Encounter Medications as of 09/02/2021  Medication Sig   Cholecalciferol (VITAMIN D3) 125 MCG (5000 UT) CAPS Take 1 capsule (5,000 Units total) by mouth daily.   [DISCONTINUED] Cholecalciferol (VITAMIN D3) 25 MCG (1000 UT) CAPS Take 1 capsule by mouth daily in the afternoon.   allopurinol (ZYLOPRIM) 100 MG tablet Take 100 mg by mouth daily.   amLODipine (NORVASC) 10 MG tablet Take 10 mg by mouth daily.   aspirin EC 81 MG tablet Take 81 mg by mouth daily.   atorvastatin (LIPITOR) 40 MG tablet Take 40 mg by mouth daily.   carvedilol (COREG) 3.125 MG tablet  Take 3.125 mg by mouth 2 (two) times daily.   folic acid (FOLVITE) 1 MG tablet Take 1 mg by mouth daily.   hydrochlorothiazide (HYDRODIURIL) 25 MG tablet Take 25 mg by mouth daily. (Patient not taking: Reported on 09/02/2021)   losartan (COZAAR) 100 MG tablet Take 100 mg by mouth daily.   naproxen (NAPROSYN) 500 MG tablet Take 500 mg by mouth as needed.   omeprazole (PRILOSEC) 20 MG capsule Take 20 mg by mouth daily.   polyethylene glycol-electrolytes (NULYTELY/GOLYTELY) 420 g solution Take 4,000 mLs by mouth once. (Patient not taking: Reported on 01/26/2019)   [DISCONTINUED] atorvastatin (LIPITOR) 80 MG tablet Take 80 mg by mouth daily.   No facility-administered encounter medications on file as of 09/02/2021.    No Known Allergies   HPI  Stephanie [name] was diagnosed with hypercalcemia in 2021.  She did have several instances of elevated calcium in the last year.  She is not known  to have parathyroid, thyroid, thyroid dysfunction   -Review of herreferral package of most recent labs reveals calcium of 11 milligrams per DL with the corresponding PTH of 84 on March 25, 2021.    -She does not have bone density study to review.   No prior history of fragility fractures or falls. No history of  kidney stones.   She has normal renal function  she is not on HCTZ or other thiazide therapy.  She has vitamin D deficiency, currently on vitamin D3 1000 units daily.    she is not on calcium supplements,  she eats dairy and green, leafy, vegetables on average amounts.  she does not have a family history of hypercalcemia, pituitary tumors, thyroid cancer, or osteoporosis.  She denies any height loss.  Her medical history includes alcohol abuse, her magnesium status not known.    ROS:  Constitutional: + Minimally fluctuating body weight,  no fatigue, no subjective hyperthermia, no subjective hypothermia Eyes: no blurry vision, no xerophthalmia ENT: no sore throat, no nodules palpated in  throat, no dysphagia/odynophagia, no hoarseness Cardiovascular: no Chest Pain, no Shortness of Breath, no palpitations, no leg swelling Respiratory: no cough, no shortness of breath  Gastrointestinal: no Nausea/Vomiting/Diarhhea Musculoskeletal: no muscle/joint aches Skin: no rashes Neurological: no tremors, no numbness, no tingling, no dizziness Psychiatric: no depression, no anxiety  PE: BP 110/70   Pulse 64   Ht 5\' 2"  (1.575 m)   Wt 118 lb 6.4 oz (53.7 kg)   BMI 21.66 kg/m , Body mass index is 21.66 kg/m. Wt Readings from Last 3 Encounters:  09/02/21 118 lb 6.4 oz (53.7 kg)  01/26/19 121 lb 12.8 oz (55.2 kg)  05/23/16 130 lb (59 kg)    Constitutional: + BMI of 26.6, not in acute distress, normal state of mind Eyes: PERRLA, EOMI, no exophthalmos ENT: moist mucous membranes, no gross thyromegaly, no gross cervical lymphadenopathy Cardiovascular: normal precordial activity, Regular Rate and Rhythm, no Murmur/Rubs/Gallops Respiratory:  adequate breathing efforts, no gross chest deformity, Clear to auscultation bilaterally Gastrointestinal: abdomen soft, Non -tender, No distension, Bowel Sounds present Musculoskeletal: no gross deformities, strength intact in all four extremities Skin: moist, warm, no rashes Neurological: no tremor with outstretched hands, Deep tendon reflexes normal in bilateral lower extremities.    Recent Results (from the past 2160 hour(s))  Basic metabolic panel     Status: None   Collection Time: 06/13/21 12:00 AM  Result Value Ref Range   BUN 13 4 - 21   Creatinine 0.7 0.5 - 1.1  Comprehensive metabolic panel     Status: None   Collection Time: 06/13/21 12:00 AM  Result Value Ref Range   Calcium 10.6 8.7 - 10.7   Her referral package includes the following studies Ultrasound of the kidneys in July 2022 were normal.  24-hour urine collection showed on 3 mg / 24 hours of calcium.   May 07, 2021 parathyroid scan showed positive exam for presence of  left inferior parathyroid adenoma.   Assessment: 1. Hypercalcemia / Hyperparathyroidism  Plan: Patient has had several instances of elevated calcium, with the highest level being at 11 mg/dL. A corresponding intact PTH level was also high, at 84.  - Patient also  has vitamin D deficiency,  with the last level being 19.  -So far, she does not report complications, no history of  nephrolithiasis,  osteoporosis,fragility fractures. No abdominal pain, no major mood disorders, no bone pain.  -She will be sent for bone density. -The etiology of  her hypercalcemia is still most likely primary hyperparathyroidism.  - I discussed with the patient about the physiology of calcium and parathyroid hormone, and possible  effects of  increased PTH/ Calcium , including kidney stones, cardiac dysrhythmias, osteoporosis, abdominal pain, etc.   - The work up so far is not sufficient to reach a conclusion for definitive therapy.  -Her 24-hour urine calcium was low normal at 103.  Still, this is unlikely for [place]. -I will also send her for labs to measure magnesium, repeat PTH/calcium, phosphorus. She will be reassessed in 2 weeks with her results.  She wishes to avoid surgery.  If she is confirmed to have primary hyperparathyroidism.  She will be considered for Sensipar intervention if she presents with calcium 1 mg per DL above upper limit of normal.   - I will request for her next DEXA scan to include the distal  33% of  radius for evaluation of cortical bone, which is predominantly affected by hyperparathyroidism.    - Time spent with the patient: 60 minutes, of which >50% was spent in obtaining information about her symptoms, reviewing her previous labs, evaluations, and treatments, counseling her about her hypercalcemia/hyperparathyroidism, vitamin D deficiency, and developing a plan to confirm the diagnosis and long term treatment as necessary.  Please refer to " Patient Self Inventory" in the Media  tab  for reviewed elements of pertinent patient history.  Deon Pilling participated in the discussions, expressed understanding, and voiced agreement with the above plans.  All questions were answered to her satisfaction. she is encouraged to contact clinic should she have any questions or concerns prior to her return visit. She is also counseled on slowly weaning off of alcohol.  - Return in about 2 weeks (around 09/16/2021) for Labs Today- Non-Fasting Ok, DXA Scan B4 NV.   Glade Lloyd, MD [facility] [address] [place], [place] [address] Phone: [phone]  Fax: [phone]    This note was partially dictated with voice recognition software. Similar sounding words can be transcribed inadequately or may not  be corrected upon review.  09/02/2021, 4:36 PM

## 2021-09-03 DIAGNOSIS — E210 Primary hyperparathyroidism: Secondary | ICD-10-CM | POA: Diagnosis not present

## 2021-09-04 LAB — PTH, INTACT AND CALCIUM
Calcium: 11 mg/dL — ABNORMAL HIGH (ref 8.7–10.3)
PTH: 48 pg/mL (ref 15–65)

## 2021-09-04 LAB — PHOSPHORUS: Phosphorus: 3.5 mg/dL (ref 3.0–4.3)

## 2021-09-04 LAB — MAGNESIUM: Magnesium: 1.9 mg/dL (ref 1.6–2.3)

## 2021-09-06 DIAGNOSIS — N182 Chronic kidney disease, stage 2 (mild): Secondary | ICD-10-CM | POA: Diagnosis not present

## 2021-09-06 DIAGNOSIS — R718 Other abnormality of red blood cells: Secondary | ICD-10-CM | POA: Diagnosis not present

## 2021-09-06 DIAGNOSIS — E210 Primary hyperparathyroidism: Secondary | ICD-10-CM | POA: Diagnosis not present

## 2021-09-06 DIAGNOSIS — I129 Hypertensive chronic kidney disease with stage 1 through stage 4 chronic kidney disease, or unspecified chronic kidney disease: Secondary | ICD-10-CM | POA: Diagnosis not present

## 2021-09-06 DIAGNOSIS — E559 Vitamin D deficiency, unspecified: Secondary | ICD-10-CM | POA: Diagnosis not present

## 2021-09-06 DIAGNOSIS — R809 Proteinuria, unspecified: Secondary | ICD-10-CM | POA: Diagnosis not present

## 2021-09-08 DIAGNOSIS — K219 Gastro-esophageal reflux disease without esophagitis: Secondary | ICD-10-CM | POA: Diagnosis not present

## 2021-09-08 DIAGNOSIS — I10 Essential (primary) hypertension: Secondary | ICD-10-CM | POA: Diagnosis not present

## 2021-09-17 ENCOUNTER — Other Ambulatory Visit: Payer: Self-pay

## 2021-09-17 ENCOUNTER — Ambulatory Visit: Payer: Medicare HMO | Admitting: "Endocrinology

## 2021-09-17 ENCOUNTER — Encounter: Payer: Self-pay | Admitting: "Endocrinology

## 2021-09-17 VITALS — BP 124/62 | HR 52 | Ht 62.0 in | Wt 119.0 lb

## 2021-09-17 DIAGNOSIS — E559 Vitamin D deficiency, unspecified: Secondary | ICD-10-CM

## 2021-09-17 DIAGNOSIS — E210 Primary hyperparathyroidism: Secondary | ICD-10-CM

## 2021-09-17 MED ORDER — CINACALCET HCL 30 MG PO TABS: 30.0000 mg | ORAL_TABLET | Freq: Every day | ORAL | 1 refills | Status: DC

## 2021-09-17 NOTE — Progress Notes (Signed)
09/17/2021, 4:18 PM  Endocrinology follow-up note  Stephanie [name] is a 70 y.o.-year-old female, referred by her  Rosita Fire, MD  , for evaluation for hypercalcemia/hyperparathyroidism.  She is here to follow-up with repeat labs.  Past Medical History:  Diagnosis Date   GERD (gastroesophageal reflux disease)    HTN (hypertension)    Hyperlipidemia    Hyperparathyroidism ([place])    Vitamin D deficiency     Past Surgical History:  Procedure Laterality Date   APPENDECTOMY     COLONOSCOPY  09/03/2006   BJY:NWGN diverticulosis in the sigmoid colon.  Otherwise normal colon   COLONOSCOPY N/A 05/23/2016   Procedure: COLONOSCOPY;  Surgeon: Danie Binder, MD;  [facility];  Service: Endoscopy;  Laterality: N/A;  1000    Social History   Tobacco Use   Smoking status: Every Day    Packs/day: 1.00    Years: 20.00    Pack years: 20.00    Types: Cigarettes   Smokeless tobacco: Never  Substance Use Topics   Alcohol use: Yes    Alcohol/week: 12.0 standard drinks    Types: 6 Cans of beer, 6 Standard drinks or equivalent per week    Comment: 6 pack in a week   Drug use: Yes    Types: Marijuana    Family History  Problem Relation Age of Onset   Hypertension Mother    Hyperlipidemia Mother    Hypertension Father    Colon cancer Neg Hx    Colon polyps Neg Hx    Liver disease Neg Hx     Outpatient Encounter Medications as of 09/17/2021  Medication Sig   acetaminophen (TYLENOL) 325 MG tablet Take 325 mg by mouth every 6 (six) hours as needed.   Cholecalciferol (VITAMIN D3 PO) Take 6,000 Units by mouth daily in the afternoon.   cinacalcet (SENSIPAR) 30 MG tablet Take 1 tablet (30 mg total) by mouth daily with breakfast.   allopurinol (ZYLOPRIM) 100 MG tablet Take 100 mg by mouth daily.   amLODipine (NORVASC) 10 MG tablet Take 10 mg by mouth daily.   aspirin EC 81 MG tablet Take 81 mg by mouth daily.   atorvastatin (LIPITOR) 40 MG tablet Take 40 mg by mouth  daily.   carvedilol (COREG) 3.125 MG tablet Take 3.125 mg by mouth 2 (two) times daily.   folic acid (FOLVITE) 1 MG tablet Take 1 mg by mouth daily.   losartan (COZAAR) 100 MG tablet Take 100 mg by mouth daily.   naproxen (NAPROSYN) 500 MG tablet Take 500 mg by mouth as needed. (Patient not taking: Reported on 09/17/2021)   omeprazole (PRILOSEC) 20 MG capsule Take 20 mg by mouth daily.   [DISCONTINUED] Cholecalciferol (VITAMIN D3) 125 MCG (5000 UT) CAPS Take 1 capsule (5,000 Units total) by mouth daily.   [DISCONTINUED] hydrochlorothiazide (HYDRODIURIL) 25 MG tablet Take 25 mg by mouth daily. (Patient not taking: Reported on 09/02/2021)   [DISCONTINUED] polyethylene glycol-electrolytes (NULYTELY/GOLYTELY) 420 g solution Take 4,000 mLs by mouth once. (Patient not taking: Reported on 01/26/2019)   No facility-administered encounter medications on file as of 09/17/2021.    No Known Allergies   HPI  BLAZE SANDIN was diagnosed with hypercalcemia in 2021.  She did have several instances of elevated calcium in the last year.  Her most recent labs show still persistently elevated calcium at 11 mg per DL, associated with high normal PTH of 48.    She has not done  her bone density yet.  No prior history of fragility fractures or falls. No history of  kidney stones.   She has normal renal function  she is not on HCTZ or other thiazide therapy.  She has vitamin D deficiency, currently on vitamin D3 6000 units daily.   she is not on calcium supplements,  she eats dairy and green, leafy, vegetables on average amounts.  she does not have a family history of hypercalcemia, pituitary tumors, thyroid cancer, or osteoporosis.  She denies any height loss.  Her medical history includes alcohol abuse, her magnesium status not known.    ROS:  Constitutional: + Minimally fluctuating body weight,  no fatigue, no subjective hyperthermia, no subjective hypothermia Eyes: no blurry vision, no  xerophthalmia ENT: no sore throat, no nodules palpated in throat, no dysphagia/odynophagia, no hoarseness Cardiovascular: no Chest Pain, no Shortness of Breath, no palpitations, no leg swelling Respiratory: no cough, no shortness of breath  Gastrointestinal: no Nausea/Vomiting/Diarhhea Musculoskeletal: no muscle/joint aches Skin: no rashes Neurological: no tremors, no numbness, no tingling, no dizziness Psychiatric: no depression, no anxiety  PE: BP 124/62   Pulse (!) 52   Ht 5\' 2"  (1.575 m)   Wt 119 lb (54 kg)   BMI 21.77 kg/m , Body mass index is 21.77 kg/m. Wt Readings from Last 3 Encounters:  09/17/21 119 lb (54 kg)  09/02/21 118 lb 6.4 oz (53.7 kg)  01/26/19 121 lb 12.8 oz (55.2 kg)    Constitutional: + BMI of 26.6, not in acute distress, normal state of mind Eyes: PERRLA, EOMI, no exophthalmos ENT: moist mucous membranes, no gross thyromegaly, no gross cervical lymphadenopathy Cardiovascular: normal precordial activity, Regular Rate and Rhythm, no Murmur/Rubs/Gallops Respiratory:  adequate breathing efforts, no gross chest deformity, Clear to auscultation bilaterally Gastrointestinal: abdomen soft, Non -tender, No distension, Bowel Sounds present Musculoskeletal: no gross deformities, strength intact in all four extremities Skin: moist, warm, no rashes Neurological: no tremor with outstretched hands, Deep tendon reflexes normal in bilateral lower extremities.    Recent Results (from the past 2160 hour(s))  PTH, intact and calcium     Status: Abnormal   Collection Time: 09/03/21  8:42 AM  Result Value Ref Range   Calcium 11.0 (H) 8.7 - 10.3 mg/dL   PTH 48 15 - 65 pg/mL   PTH Interp Comment     Comment: Interpretation                 Intact PTH    Calcium                                 (pg/mL)      (mg/dL) Normal                          15 - 65     8.6 - 10.2 Primary Hyperparathyroidism         >65          >10.2 Secondary Hyperparathyroidism       >65           <10.2 Non-Parathyroid Hypercalcemia       <65          >10.2 Hypoparathyroidism                  <15          < 8.6 Non-Parathyroid Hypocalcemia    15 -  65          < 8.6   Magnesium     Status: None   Collection Time: 09/03/21  8:42 AM  Result Value Ref Range   Magnesium 1.9 1.6 - 2.3 mg/dL  Phosphorus     Status: None   Collection Time: 09/03/21  8:42 AM  Result Value Ref Range   Phosphorus 3.5 3.0 - 4.3 mg/dL   Her referral package includes the following studies Ultrasound of the kidneys in July 2022 were normal.  24-hour urine collection showed on 3 mg / 24 hours of calcium.   May 07, 2021 parathyroid scan showed positive exam for presence of left inferior parathyroid adenoma.  Recent Results (from the past 2160 hour(s))  PTH, intact and calcium     Status: Abnormal   Collection Time: 09/03/21  8:42 AM  Result Value Ref Range   Calcium 11.0 (H) 8.7 - 10.3 mg/dL   PTH 48 15 - 65 pg/mL   PTH Interp Comment     Comment: Interpretation                 Intact PTH    Calcium                                 (pg/mL)      (mg/dL) Normal                          15 - 65     8.6 - 10.2 Primary Hyperparathyroidism         >65          >10.2 Secondary Hyperparathyroidism       >65          <10.2 Non-Parathyroid Hypercalcemia       <65          >10.2 Hypoparathyroidism                  <15          < 8.6 Non-Parathyroid Hypocalcemia    15 - 65          < 8.6   Magnesium     Status: None   Collection Time: 09/03/21  8:42 AM  Result Value Ref Range   Magnesium 1.9 1.6 - 2.3 mg/dL  Phosphorus     Status: None   Collection Time: 09/03/21  8:42 AM  Result Value Ref Range   Phosphorus 3.5 3.0 - 4.3 mg/dL    Assessment: 1. Hypercalcemia / Hyperparathyroidism 2.  Vitamin D deficiency  Plan: Patient has had several instances of elevated calcium, with the highest level being 11 mg per DL with a corresponding PTH of 48, improving from 84.   -She has a parathyroid sestamibi scan in May  2022 showing left inferior possible parathyroid adenoma. -Patient wishes to avoid surgery.  She is urged to get her bone density completed.  She would benefit from calci mimetic medications.  I discussed and prescribe Sensipar 30 mg p.o. daily at breakfast with plan to repeat PTH, calcium before her next visit.  She is advised to continue vitamin D3 6000 units daily.  -So far, she does not report complications, no history of  nephrolithiasis,  osteoporosis,fragility fractures. No abdominal pain, no major mood disorders, no bone pain.  -The etiology of her hypercalcemia is still most likely primary hyperparathyroidism.  - I discussed  with the patient about the physiology of calcium and parathyroid hormone, and possible  effects of  increased PTH/ Calcium , including kidney stones, cardiac dysrhythmias, osteoporosis, abdominal pain, etc.    -Her 24-hour urine calcium was low normal at 103.  Still, this is unlikely for [place].  - I will request for her next DEXA scan to include the distal  33% of  radius for evaluation of cortical bone, which is predominantly affected by hyperparathyroidism.   She is also counseled on slowly weaning off of alcohol.   I spent 22 minutes in the care of the patient today including review of labs from Thyroid Function, CMP, and other relevant labs ; imaging/biopsy records (current and previous including abstractions from other facilities); face-to-face time discussing  her lab results and symptoms, medications doses, her options of short and long term treatment based on the latest standards of care / guidelines;   and documenting the encounter.  Deon Pilling  participated in the discussions, expressed understanding, and voiced agreement with the above plans.  All questions were answered to her satisfaction. she is encouraged to contact clinic should she have any questions or concerns prior to her return visit.   - Return in about 4 months (around 01/18/2022) for F/U  with Pre-visit Labs.   Glade Lloyd, MD [facility] [address] [place], [place] [address] Phone: [phone]  Fax: [phone]    This note was partially dictated with voice recognition software. Similar sounding words can be transcribed inadequately or may not  be corrected upon review.  09/17/2021, 4:18 PM

## 2021-09-25 ENCOUNTER — Ambulatory Visit (HOSPITAL_COMMUNITY)
Admission: RE | Admit: 2021-09-25 | Discharge: 2021-09-25 | Disposition: A | Discharge status: Home / Self Care | Payer: Medicare HMO | Source: Ambulatory Visit | Attending: "Endocrinology | Admitting: "Endocrinology

## 2021-09-25 ENCOUNTER — Other Ambulatory Visit: Payer: Self-pay

## 2021-09-25 DIAGNOSIS — E210 Primary hyperparathyroidism: Secondary | ICD-10-CM | POA: Insufficient documentation

## 2021-09-25 DIAGNOSIS — M85851 Other specified disorders of bone density and structure, right thigh: Secondary | ICD-10-CM | POA: Diagnosis not present

## 2021-09-25 DIAGNOSIS — M85832 Other specified disorders of bone density and structure, left forearm: Secondary | ICD-10-CM | POA: Diagnosis not present

## 2021-09-25 DIAGNOSIS — Z780 Asymptomatic menopausal state: Secondary | ICD-10-CM | POA: Diagnosis not present

## 2021-09-27 ENCOUNTER — Other Ambulatory Visit: Payer: Self-pay

## 2021-09-27 ENCOUNTER — Encounter: Payer: Self-pay | Admitting: "Endocrinology

## 2021-09-27 ENCOUNTER — Ambulatory Visit: Payer: Medicare HMO | Admitting: "Endocrinology

## 2021-09-27 VITALS — BP 116/58 | HR 60 | Ht 62.0 in | Wt 117.8 lb

## 2021-09-27 DIAGNOSIS — E210 Primary hyperparathyroidism: Secondary | ICD-10-CM

## 2021-09-27 DIAGNOSIS — E559 Vitamin D deficiency, unspecified: Secondary | ICD-10-CM | POA: Diagnosis not present

## 2021-09-27 NOTE — Progress Notes (Signed)
09/27/2021, 12:22 PM  Endocrinology follow-up note  Stephanie [name] is a 70 y.o.-year-old female, referred by her  Stephanie [name] . She was seen here in consultation for hypercalcemia/hyperparathyroidism.  She is here to follow-up with repeat labs, and bone density.  Past Medical History:  Diagnosis Date   GERD (gastroesophageal reflux disease)    HTN (hypertension)    Hyperlipidemia    Hyperparathyroidism ([place])    Vitamin D deficiency     Past Surgical History:  Procedure Laterality Date   APPENDECTOMY     COLONOSCOPY  09/03/2006   TZG:YFVC diverticulosis in the sigmoid colon.  Otherwise normal colon   COLONOSCOPY N/A 05/23/2016   Procedure: COLONOSCOPY;  Surgeon: Stephanie [name];  Location: [facility];  Service: Endoscopy;  Laterality: N/A;  1000    Social History   Tobacco Use   Smoking status: Every Day    Packs/day: 1.00    Years: 20.00    Pack years: 20.00    Types: Cigarettes   Smokeless tobacco: Never  Substance Use Topics   Alcohol use: Yes    Alcohol/week: 12.0 standard drinks    Types: 6 Cans of beer, 6 Standard drinks or equivalent per week    Comment: 6 pack in a week   Drug use: Yes    Types: Marijuana    Family History  Problem Relation Age of Onset   Hypertension Mother    Hyperlipidemia Mother    Hypertension Father    Colon cancer Neg Hx    Colon polyps Neg Hx    Liver disease Neg Hx     Outpatient Encounter Medications as of 09/27/2021  Medication Sig   acetaminophen (TYLENOL) 325 MG tablet Take 325 mg by mouth every 6 (six) hours as needed.   allopurinol (ZYLOPRIM) 100 MG tablet Take 100 mg by mouth daily.   amLODipine (NORVASC) 10 MG tablet Take 10 mg by mouth daily.   aspirin EC 81 MG tablet Take 81 mg by mouth daily.   atorvastatin (LIPITOR) 40 MG tablet Take 40 mg by mouth daily.   carvedilol (COREG) 3.125 MG tablet Take 3.125 mg by mouth 2 (two) times daily.   Cholecalciferol (VITAMIN D3 PO) Take 6,000 Units by  mouth daily in the afternoon.   cinacalcet (SENSIPAR) 30 MG tablet Take 1 tablet (30 mg total) by mouth daily with breakfast. (Patient not taking: Reported on 94/49/6759)   folic acid (FOLVITE) 1 MG tablet Take 1 mg by mouth daily.   losartan (COZAAR) 100 MG tablet Take 100 mg by mouth daily.   naproxen (NAPROSYN) 500 MG tablet Take 500 mg by mouth as needed. (Patient not taking: Reported on 09/17/2021)   omeprazole (PRILOSEC) 20 MG capsule Take 20 mg by mouth daily.   No facility-administered encounter medications on file as of 09/27/2021.    No Known Allergies   HPI  Stephanie [name] was diagnosed with hypercalcemia in 2021.  She did have several instances of elevated calcium in the last year.  Her most recent labs show still persistently elevated calcium at 11 mg per DL, associated with high normal PTH of 48.    She wished to avoid surgery, was given prescription for Sensipar.  However, unfortunately she is not affording the co-pays and wants to reconsider surgical treatment for hyperparathyroidism.  Her bone density from September 25, 2021 shows osteopenia on the hips and forearms.  No prior history of fragility fractures or falls. No history  of  kidney stones.   She has normal renal function  she is not on HCTZ or other thiazide therapy.  She has vitamin D deficiency, currently on vitamin D3 6000 units daily.   she is not on calcium supplements,  she eats dairy and green, leafy, vegetables on average amounts.  she does not have a family history of hypercalcemia, pituitary tumors, thyroid cancer, or osteoporosis.  She denies any height loss.  Her medical history includes alcohol abuse, her magnesium status not known.    ROS:  Constitutional: + Minimally fluctuating body weight,  no fatigue, no subjective hyperthermia, no subjective hypothermia   PE: BP (!) 116/58   Pulse 60   Ht 5\' 2"  (1.575 m)   Wt 117 lb 12.8 oz (53.4 kg)   BMI 21.55 kg/m , Body mass index is 21.55  kg/m. Wt Readings from Last 3 Encounters:  09/27/21 117 lb 12.8 oz (53.4 kg)  09/17/21 119 lb (54 kg)  09/02/21 118 lb 6.4 oz (53.7 kg)    Constitutional: + BMI of 26.6, not in acute distress, normal state of mind Eyes: PERRLA, EOMI, no exophthalmos ENT: moist mucous membranes, no gross thyromegaly, no gross cervical lymphadenopathy   Recent Results (from the past 2160 hour(s))  PTH, intact and calcium     Status: Abnormal   Collection Time: 09/03/21  8:42 AM  Result Value Ref Range   Calcium 11.0 (H) 8.7 - 10.3 mg/dL   PTH 48 15 - 65 pg/mL   PTH Interp Comment     Comment: Interpretation                 Intact PTH    Calcium                                 (pg/mL)      (mg/dL) Normal                          15 - 65     8.6 - 10.2 Primary Hyperparathyroidism         >65          >10.2 Secondary Hyperparathyroidism       >65          <10.2 Non-Parathyroid Hypercalcemia       <65          >10.2 Hypoparathyroidism                  <15          < 8.6 Non-Parathyroid Hypocalcemia    15 - 65          < 8.6   Magnesium     Status: None   Collection Time: 09/03/21  8:42 AM  Result Value Ref Range   Magnesium 1.9 1.6 - 2.3 mg/dL  Phosphorus     Status: None   Collection Time: 09/03/21  8:42 AM  Result Value Ref Range   Phosphorus 3.5 3.0 - 4.3 mg/dL   Her referral package includes the following studies Ultrasound of the kidneys in July 2022 were normal.  24-hour urine collection showed on 3 mg / 24 hours of calcium.   May 07, 2021 parathyroid scan showed positive exam for presence of left inferior parathyroid adenoma.  Recent Results (from the past 2160 hour(s))  PTH, intact and calcium     Status: Abnormal  Collection Time: 09/03/21  8:42 AM  Result Value Ref Range   Calcium 11.0 (H) 8.7 - 10.3 mg/dL   PTH 48 15 - 65 pg/mL   PTH Interp Comment     Comment: Interpretation                 Intact PTH    Calcium                                 (pg/mL)      (mg/dL) Normal                           15 - 65     8.6 - 10.2 Primary Hyperparathyroidism         >65          >10.2 Secondary Hyperparathyroidism       >65          <10.2 Non-Parathyroid Hypercalcemia       <65          >10.2 Hypoparathyroidism                  <15          < 8.6 Non-Parathyroid Hypocalcemia    15 - 65          < 8.6   Magnesium     Status: None   Collection Time: 09/03/21  8:42 AM  Result Value Ref Range   Magnesium 1.9 1.6 - 2.3 mg/dL  Phosphorus     Status: None   Collection Time: 09/03/21  8:42 AM  Result Value Ref Range   Phosphorus 3.5 3.0 - 4.3 mg/dL    Bone density September 25, 2021 DualFemur Total Right 09/25/2021 68.6 Osteopenia -2.3 0.713 g/cm2 -  DualFemur Total Mean 09/25/2021 68.6 Osteopenia -2.2 0.728 g/cm2 - -   Left Forearm Radius 33% 09/25/2021 68.6 Osteopenia -1.7 0.593 g/cm2 - - ASSESSMENT: The BMD measured at Femur Total Right is 0.713 g/cm2 with a T-score of -2.3. This patient is considered osteopenic according to [facility]) criteria. The scan quality is good. Lumbar spine was excluded due to advanced degenerative changes.   Assessment: 1. Hypercalcemia / Hyperparathyroidism 2.  Osteopenia-hips/forearm 3.  Vitamin D deficiency  Plan: Patient has had several instances of elevated calcium, with the highest level being 11 mg per DL with a corresponding PTH of 48, improving from 84.   -She has a parathyroid sestamibi scan in May 2022 showing left inferior possible parathyroid adenoma.  Her presentation is still consistent with primary hyperparathyroidism. Could not afford co-pays for Sensipar. -She now wants to consider her surgical options.  -Her bone density shows osteopenia of hips and forearms.  She is a candidate for definitive intervention.  I discussed and referred her to [facility] Harlow As[facility].   She is advised to continue vitamin D3 6000 units daily. -Her 24-hour urine calcium was low normal at 103.  Still, this  is unlikely for [place].  She will return for follow-up after her surgery.  She is also counseled on slowly weaning off of alcohol. The patient was counseled on the dangers of tobacco use, and was advised to quit.  Reviewed strategies to maximize success, including removing cigarettes and smoking materials from environment.  She is advised to continue follow-up with her PMD.   I spent 25 minutes in the  care of the patient today including review of labs from Thyroid Function, CMP, and other relevant labs ; imaging/biopsy records (current and previous including abstractions from other facilities); face-to-face time discussing  her lab results and symptoms, medications doses, her options of short and long term treatment based on the latest standards of care / guidelines;   and documenting the encounter.  Deon Pilling  participated in the discussions, expressed understanding, and voiced agreement with the above plans.  All questions were answered to her satisfaction. she is encouraged to contact clinic should she have any questions or concerns prior to her return visit.   - Return in about 9 weeks (around 11/29/2021) for F/U with Labs after Surgery.   Glade Lloyd, [name] [address] [place], [place] [address] Phone: [phone]  Fax: [phone]    This note was partially dictated with voice recognition software. Similar sounding words can be transcribed inadequately or may not  be corrected upon review.  09/27/2021, 12:22 PM

## 2021-10-09 DIAGNOSIS — I10 Essential (primary) hypertension: Secondary | ICD-10-CM | POA: Diagnosis not present

## 2021-10-09 DIAGNOSIS — K219 Gastro-esophageal reflux disease without esophagitis: Secondary | ICD-10-CM | POA: Diagnosis not present

## 2021-11-04 ENCOUNTER — Ambulatory Visit (HOSPITAL_COMMUNITY)
Admission: RE | Admit: 2021-11-04 | Discharge: 2021-11-04 | Disposition: A | Discharge status: Home / Self Care | Payer: Medicare HMO | Source: Ambulatory Visit | Attending: Internal Medicine | Admitting: Internal Medicine

## 2021-11-04 ENCOUNTER — Other Ambulatory Visit: Payer: Self-pay

## 2021-11-04 DIAGNOSIS — Z1231 Encounter for screening mammogram for malignant neoplasm of breast: Secondary | ICD-10-CM | POA: Insufficient documentation

## 2021-11-04 DIAGNOSIS — Z23 Encounter for immunization: Secondary | ICD-10-CM | POA: Diagnosis not present

## 2021-11-08 DIAGNOSIS — N184 Chronic kidney disease, stage 4 (severe): Secondary | ICD-10-CM | POA: Diagnosis not present

## 2021-11-08 DIAGNOSIS — I10 Essential (primary) hypertension: Secondary | ICD-10-CM | POA: Diagnosis not present

## 2021-11-11 ENCOUNTER — Other Ambulatory Visit: Payer: Self-pay

## 2021-11-11 ENCOUNTER — Ambulatory Visit (HOSPITAL_COMMUNITY)
Admission: RE | Admit: 2021-11-11 | Discharge: 2021-11-11 | Disposition: A | Discharge status: Home / Self Care | Payer: Medicare HMO | Source: Ambulatory Visit | Attending: Gerontology | Admitting: Gerontology

## 2021-11-11 DIAGNOSIS — Z87891 Personal history of nicotine dependence: Secondary | ICD-10-CM | POA: Insufficient documentation

## 2021-11-14 DIAGNOSIS — H40013 Open angle with borderline findings, low risk, bilateral: Secondary | ICD-10-CM | POA: Diagnosis not present

## 2021-11-14 DIAGNOSIS — H524 Presbyopia: Secondary | ICD-10-CM | POA: Diagnosis not present

## 2021-11-14 DIAGNOSIS — R918 Other nonspecific abnormal finding of lung field: Secondary | ICD-10-CM | POA: Diagnosis not present

## 2021-11-14 DIAGNOSIS — M109 Gout, unspecified: Secondary | ICD-10-CM | POA: Diagnosis not present

## 2021-11-14 DIAGNOSIS — H5203 Hypermetropia, bilateral: Secondary | ICD-10-CM | POA: Diagnosis not present

## 2021-11-14 DIAGNOSIS — Z0001 Encounter for general adult medical examination with abnormal findings: Secondary | ICD-10-CM | POA: Diagnosis not present

## 2021-11-14 DIAGNOSIS — H2513 Age-related nuclear cataract, bilateral: Secondary | ICD-10-CM | POA: Diagnosis not present

## 2021-11-14 DIAGNOSIS — N184 Chronic kidney disease, stage 4 (severe): Secondary | ICD-10-CM | POA: Diagnosis not present

## 2021-11-14 DIAGNOSIS — E785 Hyperlipidemia, unspecified: Secondary | ICD-10-CM | POA: Diagnosis not present

## 2021-11-14 DIAGNOSIS — I10 Essential (primary) hypertension: Secondary | ICD-10-CM | POA: Diagnosis not present

## 2021-12-09 DIAGNOSIS — R69 Illness, unspecified: Secondary | ICD-10-CM | POA: Diagnosis not present

## 2021-12-09 DIAGNOSIS — I10 Essential (primary) hypertension: Secondary | ICD-10-CM | POA: Diagnosis not present

## 2021-12-12 ENCOUNTER — Ambulatory Visit: Payer: Medicare HMO | Admitting: "Endocrinology

## 2021-12-19 DIAGNOSIS — E210 Primary hyperparathyroidism: Secondary | ICD-10-CM | POA: Diagnosis not present

## 2021-12-24 ENCOUNTER — Other Ambulatory Visit: Payer: Self-pay | Admitting: Surgery

## 2021-12-24 DIAGNOSIS — E0590 Thyrotoxicosis, unspecified without thyrotoxic crisis or storm: Secondary | ICD-10-CM

## 2022-01-02 ENCOUNTER — Other Ambulatory Visit: Payer: Self-pay

## 2022-01-02 ENCOUNTER — Ambulatory Visit
Admission: RE | Admit: 2022-01-02 | Discharge: 2022-01-02 | Disposition: A | Discharge status: Home / Self Care | Payer: Medicare HMO | Source: Ambulatory Visit | Attending: Surgery | Admitting: Surgery

## 2022-01-02 DIAGNOSIS — E041 Nontoxic single thyroid nodule: Secondary | ICD-10-CM | POA: Diagnosis not present

## 2022-01-02 DIAGNOSIS — E0590 Thyrotoxicosis, unspecified without thyrotoxic crisis or storm: Secondary | ICD-10-CM

## 2022-01-06 ENCOUNTER — Ambulatory Visit: Payer: Medicare HMO | Admitting: Internal Medicine

## 2022-01-06 ENCOUNTER — Encounter: Payer: Self-pay | Admitting: Internal Medicine

## 2022-01-06 ENCOUNTER — Other Ambulatory Visit: Payer: Self-pay

## 2022-01-06 DIAGNOSIS — R69 Illness, unspecified: Secondary | ICD-10-CM | POA: Diagnosis not present

## 2022-01-06 DIAGNOSIS — R918 Other nonspecific abnormal finding of lung field: Secondary | ICD-10-CM | POA: Diagnosis not present

## 2022-01-06 DIAGNOSIS — F17210 Nicotine dependence, cigarettes, uncomplicated: Secondary | ICD-10-CM | POA: Diagnosis not present

## 2022-01-06 MED ORDER — AMOXICILLIN-POT CLAVULANATE 875-125 MG PO TABS: 1.0000 | ORAL_TABLET | Freq: Two times a day (BID) | ORAL | 0 refills | Status: AC

## 2022-01-06 NOTE — Progress Notes (Signed)
Stephanie [name], female    DOB: May 18, 1951,    MRN: [id number]   Brief patient profile:  48  yobf  active smoker  referred to pulmonary clinic in [place]  01/06/2022 by Dr Legrand Rams for abn ct       History of Present Illness  01/06/2022  Pulmonary/ 1st office eval/ Stephanie [name] / Stephanie [name] Office  Chief Complaint  Patient presents with   Consult    Patient had CT scan done and said that she was told there was a spot on her lung. Denies shortness of breath but does have productive cough with tan color    Dyspnea:  housework ok / cross parking / [employer] walking slower walking not limited by doe  Cough: mucus is yellowish  worse x oct 2022  Sleep: bed is flat/ one pillow  SABA use: not using  Two bad upper teeth no recent dental work / no recent infections   No obvious day to day or daytime variability or assoc excess/ purulent sputum or mucus plugs or hemoptysis or cp or chest tightness, subjective wheeze or overt sinus or hb symptoms.   Sleeping  without nocturnal  or early am exacerbation  of respiratory  c/o's or need for noct saba. Also denies any obvious fluctuation of symptoms with weather or environmental changes or other aggravating or alleviating factors except as outlined above   No unusual exposure hx or h/o childhood pna/ asthma or knowledge of premature birth.  Current Allergies, Complete Past Medical History, Past Surgical History, Family History, and Social History were reviewed in [employer] record.  ROS  The following are not active complaints unless bolded Hoarseness, sore throat, dysphagia, dental problems, itching, sneezing,  nasal congestion or discharge of excess mucus or purulent secretions, ear ache,   fever, chills, sweats, unintended wt loss or wt gain, classically pleuritic or exertional cp,  orthopnea pnd or arm/hand swelling  or leg swelling, presyncope, palpitations, abdominal pain, anorexia, nausea, vomiting, diarrhea  or change in bowel  habits or change in bladder habits, change in stools or change in urine, dysuria, hematuria,  rash, arthralgias, visual complaints, headache, numbness, weakness or ataxia or problems with walking or coordination,  change in mood or  memory.           Past Medical History:  Diagnosis Date   GERD (gastroesophageal reflux disease)    HTN (hypertension)    Hyperlipidemia    Hyperparathyroidism ([place])    Vitamin D deficiency     Outpatient Medications Prior to Visit  Medication Sig Dispense Refill   acetaminophen (TYLENOL) 325 MG tablet Take 325 mg by mouth every 6 (six) hours as needed.     allopurinol (ZYLOPRIM) 100 MG tablet Take 100 mg by mouth daily.     amLODipine (NORVASC) 10 MG tablet Take 10 mg by mouth daily.     aspirin EC 81 MG tablet Take 81 mg by mouth daily.     atorvastatin (LIPITOR) 40 MG tablet Take 40 mg by mouth daily.     carvedilol (COREG) 3.125 MG tablet Take 3.125 mg by mouth 2 (two) times daily.     Cholecalciferol (VITAMIN D3 PO) Take 6,000 Units by mouth daily in the afternoon.     cinacalcet (SENSIPAR) 30 MG tablet Take 1 tablet (30 mg total) by mouth daily with breakfast. 90 tablet 1   folic acid (FOLVITE) 1 MG tablet Take 1 mg by mouth daily.     losartan (COZAAR) 100 MG tablet  Take 100 mg by mouth daily.     omeprazole (PRILOSEC) 20 MG capsule Take 20 mg by mouth daily.     naproxen (NAPROSYN) 500 MG tablet Take 500 mg by mouth as needed.     No facility-administered medications prior to visit.     Objective:     BP 120/68 (BP Location: Left Arm, Patient Position: Sitting, Cuff Size: Normal)    Pulse 77    Temp 98.3 F (36.8 C) (Oral)    Ht 5\' 2"  (1.575 m)    Wt 111 lb (50.3 kg)    SpO2 99%    BMI 20.30 kg/m   SpO2: 99 % RA  Wt Readings from Last 3 Encounters:  01/06/22 111 lb (50.3 kg)  09/27/21 117 lb 12.8 oz (53.4 kg)  09/17/21 119 lb (54 kg)     General appearance:    amb bf nad   HEENT : pt wearing mask not removed for exam due to covid -  19 concerns.   NECK :  without JVD/Nodes/TM/ nl carotid upstrokes bilaterally   LUNGS: no acc muscle use,  Min barrel  contour chest wall with bilateral  slightly decreased bs s audible wheeze and  without cough on insp or exp maneuvers and min  Hyperresonant  to  percussion bilaterally     CV:  RRR  no s3 or murmur or increase in P2, and no edema   ABD:  soft and nontender with pos end  insp Hoover's  in the supine position. No bruits or organomegaly appreciated, bowel sounds nl  MS:   Nl gait/  ext warm without deformities, calf tenderness, cyanosis or clubbing No obvious joint restrictions   SKIN: warm and dry without lesions    NEURO:  alert, approp, nl sensorium with  no motor or cerebellar deficits apparent.            I personally reviewed images and agree with radiology impression as follows:   Chest LDSCT  11/11/21  vs 11/09/20 (nothing in sup segment on R)   1. Interval development of a masslike nodular area of focal airspace consolidative opacity in sup segment RLL     2. Emphysema     Assessment   No problem-specific Assessment & Plan notes found for this encounter.     Stephanie Gully, MD 01/07/2022

## 2022-01-06 NOTE — Progress Notes (Signed)
Surgery orders requested via Epic inbox. °

## 2022-01-06 NOTE — Patient Instructions (Signed)
Augmentin 875 mg take one pill twice daily  X 10 days - take at breakfast and supper with large glass of water.  It would help reduce the usual side effects (diarrhea and yeast infections) if you ate cultured yogurt at lunch.   See a dentist about getting your teeth pulled   Please remember to go to the lab department   for your tests - we will call you with the results when they are available.     Please remember to go to the  x-ray department at [facility]   for your tests - we will call you with the results when they are available.   Call if needed in the meantime

## 2022-01-07 ENCOUNTER — Encounter: Payer: Self-pay | Admitting: Internal Medicine

## 2022-01-07 DIAGNOSIS — F17210 Nicotine dependence, cigarettes, uncomplicated: Secondary | ICD-10-CM | POA: Insufficient documentation

## 2022-01-07 NOTE — Assessment & Plan Note (Addendum)
4-5 min discussion re active cigarette smoking in addition to office E&M  Ask about tobacco use:   ongoing Advise quitting  I took an extended  opportunity with this patient to outline the consequences of continued cigarette use  in airway disorders based on all the data we have from the multiple national lung health studies (perfomed over decades at millions of dollars in cost)  indicating that smoking cessation, not choice of inhalers or physicians, is the most important aspect of care.   Assess willingness:  Not committed at this point Assist in quit attempt:  Per PCP when ready Arrange follow up:   Follow up per Primary Care planned               Each maintenance medication was reviewed in detail including emphasizing most importantly the difference between maintenance and prns and under what circumstances the prns are to be triggered using an action plan format where appropriate.  Total time for H and P, chart review, counseling,  and generating customized AVS unique to this office visit / same day charting = 34 min

## 2022-01-07 NOTE — Assessment & Plan Note (Signed)
AS dz RLL sup segment  in pt with poor dentition new 11/11/21 since 11/09/20  - eval 01/06/2022 bad dentition so rec augmentin x 10 d then repeat the cxr and if no change > PET  - ESR/ crypto ag/ blasto ag/ Quant TB 01/06/2022 >>>     Hard to believe this could be lung cancer developing so rapidly and more likely therefore some form or pna or boop like infiltrate.  Will check above labs and rx as above.  Discussed in detail all the  indications, usual  risks and alternatives  relative to the benefits with patient who agrees to proceed with w/u as outlined.

## 2022-01-08 ENCOUNTER — Ambulatory Visit: Payer: Self-pay | Admitting: Surgery

## 2022-01-08 NOTE — H&P (Signed)
REFERRING PHYSICIAN:  Loni Beckwith   PROVIDER:  Elynore Dolinski Charlotta Newton, MD   Chief Complaint: New Consultation (Primary hyperparathyroidism)       History of Present Illness:   Patient is referred by Dr. Loni Beckwith for surgical evaluation and management of suspected primary hyperparathyroidism.  Patient's primary care physician is Dr. Legrand Rams.  Patient was noted on routine laboratory studies by her nephrologist to have elevated serum calcium levels.  Calcium levels measured up to 11.0.  Intact PTH levels have ranged from 48 to 84.  Vitamin D level was slightly low at 19 and the patient is taking vitamin D supplementation.  Patient had a recent bone density scan which does show evidence of osteoporosis.  Patient had undergone a nuclear medicine parathyroid scan in May 2022.  This localized a left inferior parathyroid adenoma.  Patient has not had an ultrasound examination of the neck.  Patient denies any fatigue.  She denies nephrolithiasis.  She has had no prior head or neck surgery.  There is no family history of endocrine disease or other endocrine neoplasm.  Patient is retired from the [employer].     Review of Systems: A complete review of systems was obtained from the patient.  I have reviewed this information and discussed as appropriate with the patient.  See HPI as well for other ROS.   Review of Systems  Constitutional: Negative.   HENT: Negative.   Eyes: Negative.   Respiratory: Negative.   Cardiovascular: Negative.   Gastrointestinal: Negative.   Genitourinary: Negative.   Musculoskeletal: Negative.   Skin: Negative.   Neurological: Negative.   Endo/Heme/Allergies: Negative.   Psychiatric/Behavioral: Negative.         Medical History: Past Medical History      Past Medical History:  Diagnosis Date   Anxiety     Arthritis     COPD (chronic obstructive pulmonary disease) (CMS-HCC)             Patient Active Problem List  Diagnosis    Primary hyperparathyroidism (CMS-HCC)      Past Surgical History       Past Surgical History:  Procedure Laterality Date   APPENDECTOMY            Allergies  No Known Allergies           Current Outpatient Medications on File Prior to Visit  Medication Sig Dispense Refill   folic acid (FOLVITE) 1 MG tablet Take 1 tablet by mouth once daily       amLODIPine (NORVASC) 10 MG tablet Take 10 mg by mouth once daily       aspirin 81 MG EC tablet Take by mouth       carvediloL (COREG) 3.125 MG tablet TAKE 1 TABLET BY MOUTH IN THE MORNING AND 1 TABLET IN THE EVENING TAKE WITH MEALS       cholecalciferol, vitamin D3, (CHOLECALCIFEROL, VIT D3,,BULK,) 100,000 unit/gram Powd Take by mouth       losartan (COZAAR) 100 MG tablet Take 100 mg by mouth once daily        No current facility-administered medications on file prior to visit.      Family History       Family History  Problem Relation Age of Onset   Stroke Sister     High blood pressure (Hypertension) Brother          Social History        Tobacco Use  Smoking Status Every  Day   Types: Cigarettes  Smokeless Tobacco Never      Social History  Social History         Socioeconomic History   Marital status: Legally Separated  Tobacco Use   Smoking status: Every Day      Types: Cigarettes   Smokeless tobacco: Never  Substance and Sexual Activity   Alcohol use: Yes   Drug use: Never        Objective:         Vitals:    12/19/21 1435  BP: 122/68  Pulse: 98  Temp: 36.7 C (98 F)  SpO2: 98%  Weight: 49.7 kg (109 lb 9.6 oz)  Height: 157.5 cm (5\' 2" )    Body mass index is 20.05 kg/m.   Physical Exam    GENERAL APPEARANCE Development: normal Nutritional status: normal Gross deformities: none   SKIN Rash, lesions, ulcers: none Induration, erythema: none Nodules: none palpable   EYES Conjunctiva and lids: normal Pupils: equal and reactive Iris: normal bilaterally   EARS, NOSE, MOUTH,  THROAT External ears: no lesion or deformity External nose: no lesion or deformity Hearing: grossly normal Due to Covid-19 pandemic, patient is wearing a mask.   NECK Symmetric: yes Trachea: midline Thyroid: no palpable nodules in the thyroid bed   CHEST Respiratory effort: normal Retraction or accessory muscle use: no Breath sounds: normal bilaterally Rales, rhonchi, wheeze: none   CARDIOVASCULAR Auscultation: regular rhythm, normal rate Murmurs: none Pulses: radial pulse 2+ palpable Lower extremity edema: none   MUSCULOSKELETAL Station and gait: normal Digits and nails: no clubbing or cyanosis Muscle strength: grossly normal all extremities Range of motion: grossly normal all extremities Deformity: none   LYMPHATIC Cervical: none palpable Supraclavicular: none palpable   PSYCHIATRIC Oriented to person, place, and time: yes Mood and affect: normal for situation Judgment and insight: appropriate for situation       Assessment and Plan:  Diagnoses and all orders for this visit:   Primary hyperparathyroidism (CMS-HCC)     Patient is referred by her endocrinologist for surgical evaluation and management of primary hyperparathyroidism.  Laboratory studies are consistent with primary hyperparathyroidism.  Imaging studies indicate a left inferior parathyroid adenoma.   Patient has not had an ultrasound examination of the neck.  We will order that today to rule out any concurrent thyroid disease and to hopefully confirm the position of the parathyroid adenoma.   Patient provided with a copy of "Parathyroid Surgery: Treatment for Your Parathyroid Gland Problem", published by [employer], 12 pages.  Book reviewed and explained to patient during visit today.   Today we discussed minimally invasive parathyroidectomy.  We discussed the size and location of the surgical incision.  We discussed the risk and benefits of surgery including the risk of recurrent laryngeal nerve injury.   We discussed her postoperative recovery and return to activities.  This would be performed as an outpatient surgical procedure.  Patient understands and wishes to proceed.   The risks and benefits of the procedure have been discussed at length with the patient.  The patient understands the proposed procedure, potential alternative treatments, and the course of recovery to be expected.  All of the patient's questions have been answered at this time.  The patient wishes to proceed with surgery.  Armandina Gemma, MD [facility] [place] practice Office: [phone]

## 2022-01-08 NOTE — H&P (View-Only) (Signed)
REFERRING PHYSICIAN:  Loni Beckwith   PROVIDER:  Annabelle Rexroad Charlotta Newton, MD   Chief Complaint: New Consultation (Primary hyperparathyroidism)       History of Present Illness:   Patient is referred by Dr. Loni Beckwith for surgical evaluation and management of suspected primary hyperparathyroidism.  Patient's primary care physician is Dr. Legrand Rams.  Patient was noted on routine laboratory studies by her nephrologist to have elevated serum calcium levels.  Calcium levels measured up to 11.0.  Intact PTH levels have ranged from 48 to 84.  Vitamin D level was slightly low at 19 and the patient is taking vitamin D supplementation.  Patient had a recent bone density scan which does show evidence of osteoporosis.  Patient had undergone a nuclear medicine parathyroid scan in May 2022.  This localized a left inferior parathyroid adenoma.  Patient has not had an ultrasound examination of the neck.  Patient denies any fatigue.  She denies nephrolithiasis.  She has had no prior head or neck surgery.  There is no family history of endocrine disease or other endocrine neoplasm.  Patient is retired from the [employer].     Review of Systems: A complete review of systems was obtained from the patient.  I have reviewed this information and discussed as appropriate with the patient.  See HPI as well for other ROS.   Review of Systems  Constitutional: Negative.   HENT: Negative.   Eyes: Negative.   Respiratory: Negative.   Cardiovascular: Negative.   Gastrointestinal: Negative.   Genitourinary: Negative.   Musculoskeletal: Negative.   Skin: Negative.   Neurological: Negative.   Endo/Heme/Allergies: Negative.   Psychiatric/Behavioral: Negative.         Medical History: Past Medical History      Past Medical History:  Diagnosis Date   Anxiety     Arthritis     COPD (chronic obstructive pulmonary disease) (CMS-HCC)             Patient Active Problem List  Diagnosis    Primary hyperparathyroidism (CMS-HCC)      Past Surgical History       Past Surgical History:  Procedure Laterality Date   APPENDECTOMY            Allergies  No Known Allergies           Current Outpatient Medications on File Prior to Visit  Medication Sig Dispense Refill   folic acid (FOLVITE) 1 MG tablet Take 1 tablet by mouth once daily       amLODIPine (NORVASC) 10 MG tablet Take 10 mg by mouth once daily       aspirin 81 MG EC tablet Take by mouth       carvediloL (COREG) 3.125 MG tablet TAKE 1 TABLET BY MOUTH IN THE MORNING AND 1 TABLET IN THE EVENING TAKE WITH MEALS       cholecalciferol, vitamin D3, (CHOLECALCIFEROL, VIT D3,,BULK,) 100,000 unit/gram Powd Take by mouth       losartan (COZAAR) 100 MG tablet Take 100 mg by mouth once daily        No current facility-administered medications on file prior to visit.      Family History       Family History  Problem Relation Age of Onset   Stroke Sister     High blood pressure (Hypertension) Brother          Social History        Tobacco Use  Smoking Status Every  Day   Types: Cigarettes  Smokeless Tobacco Never      Social History  Social History         Socioeconomic History   Marital status: Legally Separated  Tobacco Use   Smoking status: Every Day      Types: Cigarettes   Smokeless tobacco: Never  Substance and Sexual Activity   Alcohol use: Yes   Drug use: Never        Objective:         Vitals:    12/19/21 1435  BP: 122/68  Pulse: 98  Temp: 36.7 C (98 F)  SpO2: 98%  Weight: 49.7 kg (109 lb 9.6 oz)  Height: 157.5 cm (5\' 2" )    Body mass index is 20.05 kg/m.   Physical Exam    GENERAL APPEARANCE Development: normal Nutritional status: normal Gross deformities: none   SKIN Rash, lesions, ulcers: none Induration, erythema: none Nodules: none palpable   EYES Conjunctiva and lids: normal Pupils: equal and reactive Iris: normal bilaterally   EARS, NOSE, MOUTH,  THROAT External ears: no lesion or deformity External nose: no lesion or deformity Hearing: grossly normal Due to Covid-19 pandemic, patient is wearing a mask.   NECK Symmetric: yes Trachea: midline Thyroid: no palpable nodules in the thyroid bed   CHEST Respiratory effort: normal Retraction or accessory muscle use: no Breath sounds: normal bilaterally Rales, rhonchi, wheeze: none   CARDIOVASCULAR Auscultation: regular rhythm, normal rate Murmurs: none Pulses: radial pulse 2+ palpable Lower extremity edema: none   MUSCULOSKELETAL Station and gait: normal Digits and nails: no clubbing or cyanosis Muscle strength: grossly normal all extremities Range of motion: grossly normal all extremities Deformity: none   LYMPHATIC Cervical: none palpable Supraclavicular: none palpable   PSYCHIATRIC Oriented to person, place, and time: yes Mood and affect: normal for situation Judgment and insight: appropriate for situation       Assessment and Plan:  Diagnoses and all orders for this visit:   Primary hyperparathyroidism (CMS-HCC)     Patient is referred by her endocrinologist for surgical evaluation and management of primary hyperparathyroidism.  Laboratory studies are consistent with primary hyperparathyroidism.  Imaging studies indicate a left inferior parathyroid adenoma.   Patient has not had an ultrasound examination of the neck.  We will order that today to rule out any concurrent thyroid disease and to hopefully confirm the position of the parathyroid adenoma.   Patient provided with a copy of "Parathyroid Surgery: Treatment for Your Parathyroid Gland Problem", published by [employer], 12 pages.  Book reviewed and explained to patient during visit today.   Today we discussed minimally invasive parathyroidectomy.  We discussed the size and location of the surgical incision.  We discussed the risk and benefits of surgery including the risk of recurrent laryngeal nerve injury.   We discussed her postoperative recovery and return to activities.  This would be performed as an outpatient surgical procedure.  Patient understands and wishes to proceed.   The risks and benefits of the procedure have been discussed at length with the patient.  The patient understands the proposed procedure, potential alternative treatments, and the course of recovery to be expected.  All of the patient's questions have been answered at this time.  The patient wishes to proceed with surgery.  Armandina Gemma, MD [facility] [place] practice Office: [phone]

## 2022-01-09 DIAGNOSIS — R918 Other nonspecific abnormal finding of lung field: Secondary | ICD-10-CM | POA: Diagnosis not present

## 2022-01-09 DIAGNOSIS — I10 Essential (primary) hypertension: Secondary | ICD-10-CM | POA: Diagnosis not present

## 2022-01-09 LAB — CBC WITH DIFFERENTIAL/PLATELET
Basophils Absolute: 0.1 10*3/uL (ref 0.0–0.2)
Basos: 1 %
EOS (ABSOLUTE): 0.1 10*3/uL (ref 0.0–0.4)
Eos: 1 %
Hematocrit: 36.1 % (ref 34.0–46.6)
Hemoglobin: 12.7 g/dL (ref 11.1–15.9)
Immature Grans (Abs): 0 10*3/uL (ref 0.0–0.1)
Immature Granulocytes: 0 %
Lymphocytes Absolute: 2.9 10*3/uL (ref 0.7–3.1)
Lymphs: 22 %
MCH: 32.9 pg (ref 26.6–33.0)
MCHC: 35.2 g/dL (ref 31.5–35.7)
MCV: 94 fL (ref 79–97)
Monocytes Absolute: 0.7 10*3/uL (ref 0.1–0.9)
Monocytes: 5 %
Neutrophils Absolute: 9.5 10*3/uL — ABNORMAL HIGH (ref 1.4–7.0)
Neutrophils: 71 %
Platelets: 478 10*3/uL — ABNORMAL HIGH (ref 150–450)
RBC: 3.86 x10E6/uL (ref 3.77–5.28)
RDW: 10.9 % — ABNORMAL LOW (ref 11.7–15.4)
WBC: 13.4 10*3/uL — ABNORMAL HIGH (ref 3.4–10.8)

## 2022-01-09 LAB — SEDIMENTATION RATE: Sed Rate: 76 mm/hr — ABNORMAL HIGH (ref 0–40)

## 2022-01-09 LAB — CRYPTOCOCCAL ANTIGEN: Cryptococcus Antigen, Serum: NEGATIVE

## 2022-01-09 LAB — QUANTIFERON-TB GOLD PLUS

## 2022-01-13 NOTE — Progress Notes (Signed)
Spoke with pt and notified of results per Dr. Wert. Pt verbalized understanding and denied any questions. 

## 2022-01-15 NOTE — Progress Notes (Signed)
USN confirms the parathyroid adenoma on the left consistent with the nuclear scan.  This is good news and confirms that the minimally invasive surgery should be successful.  Plan to proceed as scheduled.  [place], MD [facility] [place] practice Office: [phone]

## 2022-01-17 LAB — BLASTOMYCES ANTIGEN: Blastomyces Antigen: NOT DETECTED ng/mL

## 2022-01-20 ENCOUNTER — Ambulatory Visit (HOSPITAL_COMMUNITY)
Admission: RE | Admit: 2022-01-20 | Discharge: 2022-01-20 | Disposition: A | Discharge status: Home / Self Care | Payer: Medicare HMO | Source: Ambulatory Visit | Attending: Internal Medicine | Admitting: Internal Medicine

## 2022-01-20 ENCOUNTER — Other Ambulatory Visit: Payer: Self-pay

## 2022-01-20 DIAGNOSIS — J189 Pneumonia, unspecified organism: Secondary | ICD-10-CM | POA: Diagnosis not present

## 2022-01-20 DIAGNOSIS — R918 Other nonspecific abnormal finding of lung field: Secondary | ICD-10-CM | POA: Insufficient documentation

## 2022-01-20 NOTE — Patient Instructions (Addendum)
DUE TO COVID-19 ONLY ONE VISITOR IS ALLOWED TO COME WITH YOU AND STAY IN THE WAITING ROOM ONLY DURING PRE OP AND PROCEDURE.   **NO VISITORS ARE ALLOWED IN THE SHORT STAY AREA OR RECOVERY ROOM!!**       Your procedure is scheduled on: 01/27/22   Report to [facility] Main Entrance    Report to admitting at 5:15 AM   Call this number if you have problems the morning of surgery [phone]   Do not eat food :After Midnight.   May have liquids until  4:30 AM day of surgery  CLEAR LIQUID DIET  Foods Allowed                                                                     Foods Excluded  Water, Black Coffee and tea, regular and decaf                             liquids that you cannot  Plain Jell-O in any flavor  (No red)                                           see through such as: Fruit ices (not with fruit pulp)                                     milk, soups, orange juice              Iced Popsicles (No red)                                    All solid food                                   Apple juices Sports drinks like Gatorade (No red) Lightly seasoned clear broth or consume(fat free) Sugar  FOLLOW BOWEL PREP AND ANY ADDITIONAL PRE OP INSTRUCTIONS YOU RECEIVED FROM YOUR SURGEON'S OFFICE!!!     Oral Hygiene is also important to reduce your risk of infection.                                    Remember - BRUSH YOUR TEETH THE MORNING OF SURGERY WITH YOUR REGULAR TOOTHPASTE   Do NOT smoke after Midnight   Stop all vitamins and supplements 7 days before surgery   Follow instructions you were given regarding Aspirin before surgery   Take these medicines the morning of surgery with A SIP OF WATER: Allopurinol, Amlodipine, Atorvastatin, Carvedilol, Omeprazole                              You may not have any metal on your body including hair pins, jewelry, and body piercing  Do not wear make-up, lotions, powders, perfumes, or deodorant  Do not wear nail  polish including gel and S&S, artificial/acrylic nails, or any other type of covering on natural nails including finger and toenails. If you have artificial nails, gel coating, etc. that needs to be removed by a nail salon please have this removed prior to surgery or surgery may need to be canceled/ delayed if the surgeon/ anesthesia feels like they are unable to be safely monitored.   Do not shave  48 hours prior to surgery.    Do not bring valuables to the hospital. [place].   Contacts, dentures or bridgework may not be worn into surgery.    Patients discharged on the day of surgery will not be allowed to drive home.  Someone needs to stay with you for the first 24 hours after anesthesia.   Special Instructions: Bring a copy of your healthcare power of attorney and living will documents         the day of surgery if you haven't scanned them before.              Please read over the following fact sheets you were given: IF YOU HAVE QUESTIONS ABOUT YOUR PRE-OP INSTRUCTIONS PLEASE CALL [facility] - Preparing for Surgery Before surgery, you can play an important role.  Because skin is not sterile, your skin needs to be as free of germs as possible.  You can reduce the number of germs on your skin by washing with CHG (chlorahexidine gluconate) soap before surgery.  CHG is an antiseptic cleaner which kills germs and bonds with the skin to continue killing germs even after washing. Please DO NOT use if you have an allergy to CHG or antibacterial soaps.  If your skin becomes reddened/irritated stop using the CHG and inform your nurse when you arrive at Short Stay. Do not shave (including legs and underarms) for at least 48 hours prior to the first CHG shower.  You may shave your face/neck.  Please follow these instructions carefully:  1.  Shower with CHG Soap the night before surgery and the  morning of surgery.  2.  If you  choose to wash your hair, wash your hair first as usual with your normal  shampoo.  3.  After you shampoo, rinse your hair and body thoroughly to remove the shampoo.                             4.  Use CHG as you would any other liquid soap.  You can apply chg directly to the skin and wash.  Gently with a scrungie or clean washcloth.  5.  Apply the CHG Soap to your body ONLY FROM THE NECK DOWN.   Do   not use on face/ open                           Wound or open sores. Avoid contact with eyes, ears mouth and   genitals (private parts).                       Wash face,  Genitals (private parts) with your normal soap.             6.  Wash thoroughly, paying special attention to the area where your    surgery  will be performed.  7.  Thoroughly rinse your body with warm water from the neck down.  8.  DO NOT shower/wash with your normal soap after using and rinsing off the CHG Soap.                9.  Pat yourself dry with a clean towel.            10.  Wear clean pajamas.            11.  Place clean sheets on your bed the night of your first shower and do not  sleep with pets. Day of Surgery : Do not apply any lotions/deodorants the morning of surgery.  Please wear clean clothes to the hospital/surgery center.  FAILURE TO FOLLOW THESE INSTRUCTIONS MAY RESULT IN THE CANCELLATION OF YOUR SURGERY  PATIENT SIGNATURE_________________________________  NURSE SIGNATURE__________________________________  ________________________________________________________________________

## 2022-01-20 NOTE — Progress Notes (Addendum)
COVID swab appointment: n/a  COVID Vaccine Completed: yes x4 Date COVID Vaccine completed: 02/02/20, 02/27/20 Has received booster: 11/20/20, 10/01/21 COVID vaccine manufacturer: [place]   Date of COVID positive in last 90 days: no  PCP - Rosita Fire, MD Cardiologist - n/a  Chest x-ray - 01/20/22 Epic EKG - 01/21/22 Epic.chart Stress Test - n/a ECHO - n/a Cardiac Cath - n/a Pacemaker/ICD device last checked: n/a Spinal Cord Stimulator: n/a  Bowel Prep - no  Sleep Study - n/a CPAP -   Fasting Blood Sugar - n/a Checks Blood Sugar _____ times a day  Blood Thinner Instructions: Aspirin Instructions: ASA 81 Last Dose: hold 5 days, last dose 01/20/22  Activity level: Can go up a flight of stairs and perform activities of daily living without stopping and without symptoms of chest pain or shortness of breath.       Anesthesia review: CXR right lower lobe pneumonia, HTN, CKD  Patient denies shortness of breath, fever, cough and chest pain at PAT appointment   Patient verbalized understanding of instructions that were given to them at the PAT appointment. Patient was also instructed that they will need to review over the PAT instructions again at home before surgery.

## 2022-01-21 ENCOUNTER — Encounter (HOSPITAL_COMMUNITY)
Admission: RE | Admit: 2022-01-21 | Discharge: 2022-01-21 | Disposition: A | Discharge status: Home / Self Care | Payer: Medicare HMO | Source: Ambulatory Visit | Attending: Surgery | Admitting: Surgery

## 2022-01-21 ENCOUNTER — Ambulatory Visit: Payer: Medicare HMO | Admitting: "Endocrinology

## 2022-01-21 ENCOUNTER — Encounter (HOSPITAL_COMMUNITY): Payer: Self-pay

## 2022-01-21 VITALS — BP 131/81 | HR 73 | Temp 97.6°F | Ht 62.0 in | Wt 111.0 lb

## 2022-01-21 DIAGNOSIS — F17210 Nicotine dependence, cigarettes, uncomplicated: Secondary | ICD-10-CM | POA: Diagnosis not present

## 2022-01-21 DIAGNOSIS — Z01818 Encounter for other preprocedural examination: Secondary | ICD-10-CM | POA: Diagnosis not present

## 2022-01-21 DIAGNOSIS — I10 Essential (primary) hypertension: Secondary | ICD-10-CM | POA: Insufficient documentation

## 2022-01-21 DIAGNOSIS — K219 Gastro-esophageal reflux disease without esophagitis: Secondary | ICD-10-CM | POA: Insufficient documentation

## 2022-01-21 DIAGNOSIS — I2510 Atherosclerotic heart disease of native coronary artery without angina pectoris: Secondary | ICD-10-CM | POA: Insufficient documentation

## 2022-01-21 DIAGNOSIS — E210 Primary hyperparathyroidism: Secondary | ICD-10-CM | POA: Insufficient documentation

## 2022-01-21 DIAGNOSIS — R918 Other nonspecific abnormal finding of lung field: Secondary | ICD-10-CM | POA: Insufficient documentation

## 2022-01-21 DIAGNOSIS — R69 Illness, unspecified: Secondary | ICD-10-CM | POA: Diagnosis not present

## 2022-01-21 HISTORY — DX: Unspecified osteoarthritis, unspecified site: M19.90

## 2022-01-21 HISTORY — DX: Pneumonia, unspecified organism: J18.9

## 2022-01-21 HISTORY — DX: Anxiety disorder, unspecified: F41.9

## 2022-01-21 LAB — BASIC METABOLIC PANEL
Anion gap: 6 (ref 5–15)
BUN: 14 mg/dL (ref 8–23)
CO2: 27 mmol/L (ref 22–32)
Calcium: 10 mg/dL (ref 8.9–10.3)
Chloride: 101 mmol/L (ref 98–111)
Creatinine, Ser: 0.53 mg/dL (ref 0.44–1.00)
GFR, Estimated: >60 mL/min (ref >60)
Glucose, Bld: 96 mg/dL (ref 70–99)
Potassium: 4.3 mmol/L (ref 3.5–5.1)
Sodium: 134 mmol/L — ABNORMAL LOW (ref 135–145)

## 2022-01-22 NOTE — Progress Notes (Signed)
Anesthesia Chart Review   Case: [id number] Date/Time: 01/27/22 0715   Procedure: LEFT INFERIOR PARATHYROIDECTOMY (Left)   Anesthesia type: General   Pre-op diagnosis: PRIMARY HYPERPARATHYROIDISM   Location: [facility] ROOM 01 / [facility]   Surgeons: Armandina Gemma, MD       DISCUSSION:70 y.o. smoker with h/o HTN, GERD, primary hyperparathyroidism scheduled for above procedure 01/27/2022 with Dr. Armandina Gemma.   Seen by pulmonology 01/06/2022 for evaluation of right lower lobe infiltrate. Per Dr. [name] stable for surgery from pulmonary standpoint.   Anticipate pt can proceed with planned procedure barring acute status change.   VS: BP 131/81    Pulse 73    Temp 36.4 C (Oral)    Ht 5\' 2"  (1.575 m)    Wt 50.3 kg    SpO2 100%    BMI 20.30 kg/m   PROVIDERS: [name], MD is PCP   Christinia Gully, MD is Cardiologist  LABS: Labs reviewed: Acceptable for surgery. (all labs ordered are listed, but only abnormal results are displayed)  Labs Reviewed  BASIC METABOLIC PANEL - Abnormal; Notable for the following components:      Result Value   Sodium 134 (*)    All other components within normal limits     IMAGES:   EKG: 01/21/2022 Rate 66 bpm  Normal sinus rhythm Possible Left atrial enlargement Septal infarct , age undetermined Abnormal ECG No previous ECGs available No previous tracing  CV:  Past Medical History:  Diagnosis Date   Anxiety    Arthritis    GERD (gastroesophageal reflux disease)    HTN (hypertension)    Hyperlipidemia    Hyperparathyroidism ([place])    Pneumonia    Vitamin D deficiency     Past Surgical History:  Procedure Laterality Date   APPENDECTOMY     COLONOSCOPY  09/03/2006   DGL:OVFI diverticulosis in the sigmoid colon.  Otherwise normal colon   COLONOSCOPY N/A 05/23/2016   Procedure: COLONOSCOPY;  Surgeon: Danie Binder, MD;  Location: [facility] SUITE;  Service: Endoscopy;  Laterality: N/A;  1000    MEDICATIONS:  allopurinol (ZYLOPRIM) 100 MG  tablet   amLODipine (NORVASC) 10 MG tablet   aspirin EC 81 MG tablet   atorvastatin (LIPITOR) 40 MG tablet   carvedilol (COREG) 3.125 MG tablet   Cholecalciferol (VITAMIN D3 PO)   cinacalcet (SENSIPAR) 30 MG tablet   folic acid (FOLVITE) 1 MG tablet   losartan (COZAAR) 100 MG tablet   omeprazole (PRILOSEC) 20 MG capsule   No current facility-administered medications for this encounter.    Konrad Felix Ward, PA-C [facility] Pre-Surgical Testing [phone]

## 2022-01-22 NOTE — Anesthesia Preprocedure Evaluation (Addendum)
Anesthesia Evaluation  Patient identified by MRN, date of birth, ID band Patient awake    Reviewed: Allergy & Precautions, NPO status , Patient's Chart, lab work & pertinent test results  Airway Mallampati: II  TM Distance: >3 FB     Dental   Pulmonary Current Smoker,    breath sounds clear to auscultation       Cardiovascular hypertension,  Rhythm:Regular Rate:Normal     Neuro/Psych    GI/Hepatic Neg liver ROS, GERD  ,  Endo/Other  negative endocrine ROS  Renal/GU negative Renal ROS     Musculoskeletal   Abdominal   Peds  Hematology   Anesthesia Other Findings   Reproductive/Obstetrics                           Anesthesia Physical Anesthesia Plan  ASA: 3  Anesthesia Plan: General   Post-op Pain Management:    Induction:   PONV Risk Score and Plan: Treatment may vary due to age or medical condition, Ondansetron, Dexamethasone and Midazolam  Airway Management Planned:   Additional Equipment:   Intra-op Plan:   Post-operative Plan:   Informed Consent: I have reviewed the patients History and Physical, chart, labs and discussed the procedure including the risks, benefits and alternatives for the proposed anesthesia with the patient or authorized representative who has indicated his/her understanding and acceptance.     Dental advisory given  Plan Discussed with: Anesthesiologist and CRNA  Anesthesia Plan Comments: (See PAT note 01/21/2022)      Anesthesia Quick Evaluation

## 2022-01-26 ENCOUNTER — Encounter (HOSPITAL_COMMUNITY): Payer: Self-pay | Admitting: Surgery

## 2022-01-27 ENCOUNTER — Ambulatory Visit (HOSPITAL_COMMUNITY): Payer: Medicare HMO | Admitting: Physician Assistant

## 2022-01-27 ENCOUNTER — Ambulatory Visit (HOSPITAL_BASED_OUTPATIENT_CLINIC_OR_DEPARTMENT_OTHER): Payer: Medicare HMO | Admitting: Anesthesiology

## 2022-01-27 ENCOUNTER — Encounter (HOSPITAL_COMMUNITY)
Admission: RE | Disposition: A | Discharge status: Home / Self Care | Payer: Self-pay | Source: Ambulatory Visit | Attending: Surgery

## 2022-01-27 ENCOUNTER — Other Ambulatory Visit: Payer: Self-pay

## 2022-01-27 ENCOUNTER — Encounter (HOSPITAL_COMMUNITY): Payer: Self-pay | Admitting: Surgery

## 2022-01-27 ENCOUNTER — Ambulatory Visit (HOSPITAL_COMMUNITY)
Admission: RE | Admit: 2022-01-27 | Discharge: 2022-01-27 | Disposition: A | Discharge status: Home / Self Care | Payer: Medicare HMO | Source: Ambulatory Visit | Attending: Surgery | Admitting: Surgery

## 2022-01-27 DIAGNOSIS — R69 Illness, unspecified: Secondary | ICD-10-CM | POA: Diagnosis not present

## 2022-01-27 DIAGNOSIS — F17210 Nicotine dependence, cigarettes, uncomplicated: Secondary | ICD-10-CM | POA: Insufficient documentation

## 2022-01-27 DIAGNOSIS — E210 Primary hyperparathyroidism: Secondary | ICD-10-CM

## 2022-01-27 DIAGNOSIS — E559 Vitamin D deficiency, unspecified: Secondary | ICD-10-CM | POA: Insufficient documentation

## 2022-01-27 DIAGNOSIS — D351 Benign neoplasm of parathyroid gland: Secondary | ICD-10-CM | POA: Diagnosis not present

## 2022-01-27 DIAGNOSIS — I10 Essential (primary) hypertension: Secondary | ICD-10-CM | POA: Insufficient documentation

## 2022-01-27 DIAGNOSIS — K219 Gastro-esophageal reflux disease without esophagitis: Secondary | ICD-10-CM

## 2022-01-27 HISTORY — PX: PARATHYROIDECTOMY: SHX19

## 2022-01-27 SURGERY — PARATHYROIDECTOMY
Anesthesia: General | Laterality: Left

## 2022-01-27 MED ORDER — LIDOCAINE 2% (20 MG/ML) 5 ML SYRINGE
INTRAMUSCULAR | Status: DC | PRN
  Administered 2022-01-27: 60 mg via INTRAVENOUS

## 2022-01-27 MED ORDER — FENTANYL CITRATE (PF) 100 MCG/2ML IJ SOLN
INTRAMUSCULAR | Status: DC | PRN
  Administered 2022-01-27: 100 ug via INTRAVENOUS

## 2022-01-27 MED ORDER — TRAMADOL HCL 50 MG PO TABS: 50.0000 mg | ORAL_TABLET | Freq: Four times a day (QID) | ORAL | 0 refills | Status: DC | PRN

## 2022-01-27 MED ORDER — PHENYLEPHRINE 40 MCG/ML (10ML) SYRINGE FOR IV PUSH (FOR BLOOD PRESSURE SUPPORT)
PREFILLED_SYRINGE | INTRAVENOUS | Status: DC | PRN
  Administered 2022-01-27: 40 ug via INTRAVENOUS
  Administered 2022-01-27 (×3): 120 ug via INTRAVENOUS

## 2022-01-27 MED ORDER — CHLORHEXIDINE GLUCONATE CLOTH 2 % EX PADS: 6.0000 | MEDICATED_PAD | Freq: Once | CUTANEOUS | Status: DC

## 2022-01-27 MED ORDER — MIDAZOLAM HCL 2 MG/2ML IJ SOLN
INTRAMUSCULAR | Status: AC
  Filled 2022-01-27: qty 2

## 2022-01-27 MED ORDER — CARVEDILOL 3.125 MG PO TABS
3.1250 mg | ORAL_TABLET | Freq: Once | ORAL | Status: AC
Start: 2022-01-27 — End: 2022-01-27
  Administered 2022-01-27: 3.125 mg via ORAL
  Filled 2022-01-27: qty 1

## 2022-01-27 MED ORDER — ORAL CARE MOUTH RINSE
15.0000 mL | Freq: Once | OROMUCOSAL | Status: AC
  Administered 2022-01-27: 15 mL via OROMUCOSAL

## 2022-01-27 MED ORDER — CARVEDILOL 3.125 MG PO TABS
3.1250 mg | ORAL_TABLET | Freq: Two times a day (BID) | ORAL | Status: DC
Start: 2022-01-27 — End: 2022-01-27

## 2022-01-27 MED ORDER — HEMOSTATIC AGENTS (NO CHARGE) OPTIME
TOPICAL | Status: DC | PRN
  Administered 2022-01-27: 1 via TOPICAL

## 2022-01-27 MED ORDER — BUPIVACAINE HCL 0.25 % IJ SOLN
INTRAMUSCULAR | Status: AC
  Filled 2022-01-27: qty 1

## 2022-01-27 MED ORDER — PROPOFOL 10 MG/ML IV BOLUS
INTRAVENOUS | Status: DC | PRN
  Administered 2022-01-27: 100 mg via INTRAVENOUS

## 2022-01-27 MED ORDER — FENTANYL CITRATE (PF) 100 MCG/2ML IJ SOLN
INTRAMUSCULAR | Status: AC
  Filled 2022-01-27: qty 2

## 2022-01-27 MED ORDER — MIDAZOLAM HCL 2 MG/2ML IJ SOLN
INTRAMUSCULAR | Status: DC | PRN
Start: 2022-01-27 — End: 2022-01-27
  Administered 2022-01-27: 1 mg via INTRAVENOUS

## 2022-01-27 MED ORDER — CHLORHEXIDINE GLUCONATE 0.12 % MT SOLN: 15.0000 mL | Freq: Once | OROMUCOSAL | Status: AC

## 2022-01-27 MED ORDER — ONDANSETRON HCL 4 MG/2ML IJ SOLN
INTRAMUSCULAR | Status: AC
  Filled 2022-01-27: qty 2

## 2022-01-27 MED ORDER — LACTATED RINGERS IV SOLN: INTRAVENOUS | Status: DC

## 2022-01-27 MED ORDER — HYDROMORPHONE HCL 1 MG/ML IJ SOLN
0.2500 mg | INTRAMUSCULAR | Status: DC | PRN
  Administered 2022-01-27: 0.25 mg via INTRAVENOUS

## 2022-01-27 MED ORDER — ONDANSETRON HCL 4 MG/2ML IJ SOLN
INTRAMUSCULAR | Status: DC | PRN
  Administered 2022-01-27: 4 mg via INTRAVENOUS

## 2022-01-27 MED ORDER — SUGAMMADEX SODIUM 200 MG/2ML IV SOLN
INTRAVENOUS | Status: DC | PRN
  Administered 2022-01-27: 100 mg via INTRAVENOUS

## 2022-01-27 MED ORDER — CEFAZOLIN SODIUM-DEXTROSE 2-4 GM/100ML-% IV SOLN
2.0000 g | INTRAVENOUS | Status: AC
  Administered 2022-01-27: 2 g via INTRAVENOUS
  Filled 2022-01-27: qty 100

## 2022-01-27 MED ORDER — DEXAMETHASONE SODIUM PHOSPHATE 10 MG/ML IJ SOLN
INTRAMUSCULAR | Status: AC
  Filled 2022-01-27: qty 1

## 2022-01-27 MED ORDER — BUPIVACAINE HCL 0.25 % IJ SOLN
INTRAMUSCULAR | Status: DC | PRN
  Administered 2022-01-27: 8 mL

## 2022-01-27 MED ORDER — EPHEDRINE 5 MG/ML INJ
INTRAVENOUS | Status: AC
  Filled 2022-01-27: qty 5

## 2022-01-27 MED ORDER — HYDROMORPHONE HCL 1 MG/ML IJ SOLN
INTRAMUSCULAR | Status: AC
  Filled 2022-01-27: qty 1

## 2022-01-27 MED ORDER — EPHEDRINE SULFATE-NACL 50-0.9 MG/10ML-% IV SOSY
PREFILLED_SYRINGE | INTRAVENOUS | Status: DC | PRN
  Administered 2022-01-27 (×4): 5 mg via INTRAVENOUS

## 2022-01-27 MED ORDER — ROCURONIUM BROMIDE 10 MG/ML (PF) SYRINGE
PREFILLED_SYRINGE | INTRAVENOUS | Status: AC
  Filled 2022-01-27: qty 10

## 2022-01-27 MED ORDER — DEXAMETHASONE SODIUM PHOSPHATE 10 MG/ML IJ SOLN
INTRAMUSCULAR | Status: DC | PRN
  Administered 2022-01-27: 5 mg via INTRAVENOUS

## 2022-01-27 MED ORDER — ROCURONIUM BROMIDE 10 MG/ML (PF) SYRINGE
PREFILLED_SYRINGE | INTRAVENOUS | Status: DC | PRN
  Administered 2022-01-27: 50 mg via INTRAVENOUS

## 2022-01-27 MED ORDER — 0.9 % SODIUM CHLORIDE (POUR BTL) OPTIME
TOPICAL | Status: DC | PRN
  Administered 2022-01-27: 1000 mL

## 2022-01-27 SURGICAL SUPPLY — 35 items
ATTRACTOMAT 16X20 MAGNETIC DRP (DRAPES) ×2 IMPLANT
BAG COUNTER SPONGE SURGICOUNT (BAG) ×2 IMPLANT
BLADE SURG 15 STRL LF DISP TIS (BLADE) ×1 IMPLANT
BLADE SURG 15 STRL SS (BLADE) ×2
CHLORAPREP W/TINT 26 (MISCELLANEOUS) ×2 IMPLANT
CLIP TI MEDIUM 6 (CLIP) ×4 IMPLANT
CLIP TI WIDE RED SMALL 6 (CLIP) ×4 IMPLANT
COVER SURGICAL LIGHT HANDLE (MISCELLANEOUS) ×2 IMPLANT
DERMABOND ADVANCED (GAUZE/BANDAGES/DRESSINGS) ×1
DERMABOND ADVANCED .7 DNX12 (GAUZE/BANDAGES/DRESSINGS) ×1 IMPLANT
DRAPE LAPAROTOMY T 98X78 PEDS (DRAPES) ×2 IMPLANT
DRAPE UTILITY XL STRL (DRAPES) ×2 IMPLANT
ELECT REM PT RETURN 15FT ADLT (MISCELLANEOUS) ×2 IMPLANT
GAUZE 4X4 16PLY ~~LOC~~+RFID DBL (SPONGE) ×2 IMPLANT
GLOVE SURG SYN 7.5  E (GLOVE) ×4
GLOVE SURG SYN 7.5 E (GLOVE) ×2 IMPLANT
GLOVE SURG SYN 7.5 PF PI (GLOVE) ×2 IMPLANT
GOWN STRL REUS W/TWL XL LVL3 (GOWN DISPOSABLE) ×6 IMPLANT
HEMOSTAT SURGICEL 2X4 FIBR (HEMOSTASIS) ×2 IMPLANT
ILLUMINATOR WAVEGUIDE N/F (MISCELLANEOUS) IMPLANT
KIT BASIN OR (CUSTOM PROCEDURE TRAY) ×2 IMPLANT
KIT TURNOVER KIT A (KITS) IMPLANT
NDL HYPO 25X1 1.5 SAFETY (NEEDLE) ×1 IMPLANT
NEEDLE HYPO 25X1 1.5 SAFETY (NEEDLE) ×2 IMPLANT
PACK BASIC VI WITH GOWN DISP (CUSTOM PROCEDURE TRAY) ×2 IMPLANT
PENCIL SMOKE EVACUATOR (MISCELLANEOUS) ×2 IMPLANT
SUT MNCRL AB 4-0 PS2 18 (SUTURE) ×2 IMPLANT
SUT SILK 2 0 (SUTURE) ×2
SUT SILK 2-0 18XBRD TIE 12 (SUTURE) IMPLANT
SUT VIC AB 3-0 SH 18 (SUTURE) ×2 IMPLANT
SYR BULB IRRIG 60ML STRL (SYRINGE) ×2 IMPLANT
SYR CONTROL 10ML LL (SYRINGE) ×2 IMPLANT
TOWEL OR 17X26 10 PK STRL BLUE (TOWEL DISPOSABLE) ×2 IMPLANT
TOWEL OR NON WOVEN STRL DISP B (DISPOSABLE) ×2 IMPLANT
TUBING CONNECTING 10 (TUBING) ×2 IMPLANT

## 2022-01-27 NOTE — Anesthesia Postprocedure Evaluation (Signed)
Anesthesia Post Note  Patient: Stephanie [name]  Procedure(s) Performed: LEFT INFERIOR PARATHYROIDECTOMY (Left)     Patient location during evaluation: PACU Anesthesia Type: General Level of consciousness: awake Pain management: pain level controlled Vital Signs Assessment: post-procedure vital signs reviewed and stable Respiratory status: spontaneous breathing Cardiovascular status: stable Postop Assessment: no apparent nausea or vomiting Anesthetic complications: no   No notable events documented.  Last Vitals:  Vitals:   01/27/22 0855 01/27/22 0900  BP:  123/66  Pulse:  64  Resp:  15  Temp:    SpO2: 97% 100%    Last Pain:  Vitals:   01/27/22 0900  TempSrc:   PainSc: 3                  Brix Brearley

## 2022-01-27 NOTE — Transfer of Care (Signed)
Immediate Anesthesia Transfer of Care Note  Patient: Deon Pilling  Procedure(s) Performed: LEFT INFERIOR PARATHYROIDECTOMY (Left)  Patient Location: PACU  Anesthesia Type:General  Level of Consciousness: awake  Airway & Oxygen Therapy: Patient Spontanous Breathing and Patient connected to face mask oxygen  Post-op Assessment: Report given to RN and Post -op Vital signs reviewed and stable  Post vital signs: Reviewed and stable  Last Vitals:  Vitals Value Taken Time  BP    Temp    Pulse 60 01/27/22 0838  Resp    SpO2 100 % 01/27/22 0838  Vitals shown include unvalidated device data.  Last Pain:  Vitals:   01/27/22 0622  TempSrc: Oral         Complications: No notable events documented.

## 2022-01-27 NOTE — Anesthesia Procedure Notes (Signed)
Procedure Name: Intubation Date/Time: 01/27/2022 7:28 AM Performed by: Sharlette Dense, CRNA Pre-anesthesia Checklist: Patient identified, Emergency Drugs available, Suction available and Patient being monitored Patient Re-evaluated:Patient Re-evaluated prior to induction Oxygen Delivery Method: Circle system utilized Preoxygenation: Pre-oxygenation with 100% oxygen Induction Type: IV induction Ventilation: Mask ventilation without difficulty and Oral airway inserted - appropriate to patient size Laryngoscope Size: Sabra Heck and 2 Grade View: Grade I Tube type: Reinforced Tube size: 7.0 mm Number of attempts: 1 Airway Equipment and Method: Stylet Placement Confirmation: ETT inserted through vocal cords under direct vision, positive ETCO2 and breath sounds checked- equal and bilateral Secured at: 21 cm Tube secured with: Tape Dental Injury: Teeth and Oropharynx as per pre-operative assessment

## 2022-01-27 NOTE — Discharge Instructions (Signed)
[facility] [place] [facility] - Dr. Kamaiya Antilla  THYROID & PARATHYROID SURGERY:  POST-OP INSTRUCTIONS  Always review the instruction sheet provided by the hospital nurse at discharge.  A prescription for pain medication may be sent to your pharmacy at the time of discharge.  Take your pain medication as prescribed.  If narcotic pain medicine is not needed, then you may take acetaminophen (Tylenol) or ibuprofen (Advil) as needed for pain or soreness.  Take your normal home medications as prescribed unless otherwise directed.  If you need a refill on your pain medication, please contact the office during regular business hours.  Prescriptions will not be processed by the office after 5:00PM or on weekends.  Start with a light diet upon arrival home, such as soup and crackers or toast.  Be sure to drink plenty of fluids.  Resume your normal diet the day after surgery.  Most patients will experience some swelling and bruising on the chest and neck area.  Ice packs will help for the first 48 hours after arriving home.  Swelling and bruising will take several days to resolve.   It is common to experience some constipation after surgery.  Increasing fluid intake and taking a stool softener (Colace) will usually help to prevent this problem.  A mild laxative (Milk of Magnesia or Miralax) should be taken according to package directions if there has been no bowel movement after 48 hours.  Dermabond glue covers your incision. This seals the wound and you may shower at any time. The Dermabond will remain in place for about a week.  You may gradually remove the glue when it loosens around the edges.  If you need to loosen the Dermabond for removal, apply a layer of Vaseline to the wound for 15 minutes and then remove with a Kleenex. Your sutures are under the skin and will not show - they will dissolve on their own.  You may resume light daily activities beginning the day after discharge (such as self-care,  walking, climbing stairs), gradually increasing activities as tolerated. You may have sexual intercourse when it is comfortable. Refrain from any heavy lifting or straining until approved by your doctor. You may drive when you no longer are taking prescription pain medication, you can comfortably wear a seatbelt, and you can safely maneuver your car and apply the brakes.  You will see your doctor in the office for a follow-up appointment approximately three weeks after your surgery.  Make sure that you call for this appointment within a day or two after you arrive home to insure a convenient appointment time. Please have any requested laboratory tests performed a few days prior to your office visit so that the results will be available at your follow up appointment.  WHEN TO CALL THE [facility] OFFICE: -- Fever greater than 101.5 -- Inability to urinate -- Nausea and/or vomiting - persistent -- Extreme swelling or bruising -- Continued bleeding from incision -- Increased pain, redness, or drainage from the incision -- Difficulty swallowing or breathing -- Muscle cramping or spasms -- Numbness or tingling in hands or around lips  The clinic staff is available to answer your questions during regular business hours.  Please don't hesitate to call and ask to speak to one of the nurses if you have concerns.  [facility] OFFICE: [phone] (24 hours)  Please sign up for MyChart accounts. This will allow you to communicate directly with my nurse or myself without having to call the office. It will also allow you   to view your test results. You will need to enroll in MyChart for my office ([facility]) and for the hospital ([place]).  Meilin Brosh, MD [facility] practice 

## 2022-01-27 NOTE — Op Note (Signed)
OPERATIVE REPORT - PARATHYROIDECTOMY  Preoperative diagnosis: Primary hyperparathyroidism  Postop diagnosis: Same  Procedure: Left inferior minimally invasive parathyroidectomy  Surgeon:  Armandina Gemma, MD  Assistant:  Carlena Hurl, PA-C  Anesthesia: General endotracheal  Estimated blood loss: Minimal  Preparation: ChloraPrep  Indications: Patient is referred by Dr. Loni Beckwith for surgical evaluation and management of suspected primary hyperparathyroidism.  Patient's primary care physician is Dr. Legrand Rams.  Patient was noted on routine laboratory studies by her nephrologist to have elevated serum calcium levels.  Calcium levels measured up to 11.0.  Intact PTH levels have ranged from 48 to 84.  Vitamin D level was slightly low at 19 and the patient is taking vitamin D supplementation.  Patient had a recent bone density scan which does show evidence of osteoporosis.  Patient had undergone a nuclear medicine parathyroid scan in May 2022.  This localized a left inferior parathyroid adenoma.   Procedure: The patient was prepared in the pre-operative holding area. The patient was brought to the operating room and placed in a supine position on the operating room table. Following administration of general anesthesia, the patient was positioned and then prepped and draped in the usual strict aseptic fashion. After ascertaining that an adequate level of anesthesia been achieved, a neck incision was made with a #15 blade. Dissection was carried through subcutaneous tissues and platysma. Hemostasis was obtained with the electrocautery. External jugular veins were divided between hemostats and ligated with 2-0 silk ties. Skin flaps were developed circumferentially and a Weitlander retractor was placed for exposure.  Strap muscles were incised in the midline. Strap muscles were reflected laterally exposing the thyroid lobe. With gentle blunt dissection the thyroid lobe was mobilized.  Dissection was  carried posteriorly and an enlarged parathyroid gland was identified inferior and posterior to the left thyroid lobe. It was gently mobilized. Vascular structures were divided between small ligaclips. Care was taken to avoid the recurrent laryngeal nerve. The parathyroid gland was completely excised. It was submitted to pathology where frozen section by Dr. Claudette Laws confirmed hypercellular parathyroid tissue consistent with adenoma.  Neck was irrigated with warm saline and good hemostasis was noted. Fibrillar was placed in the operative field. Strap muscles were approximated in the midline with interrupted 3-0 Vicryl sutures. Platysma was closed with interrupted 3-0 Vicryl sutures. Marcaine was infiltrated circumferentially. Skin was closed with interrupted 4-0 Monocryl subcuticular sutures. Wound was washed and dried and Dermabond was applied. Patient was awakened from anesthesia and brought to the recovery room. The patient tolerated the procedure well.   Armandina Gemma, MD [facility] Office: [phone]

## 2022-01-27 NOTE — Interval H&P Note (Signed)
History and Physical Interval Note:  01/27/2022 6:59 AM  Stephanie [name]  has presented today for surgery, with the diagnosis of PRIMARY HYPERPARATHYROIDISM.  The various methods of treatment have been discussed with the patient and family. After consideration of risks, benefits and other options for treatment, the patient has consented to    Procedure(s): LEFT INFERIOR PARATHYROIDECTOMY (Left) as a surgical intervention.    The patient's history has been reviewed, patient examined, no change in status, stable for surgery.  I have reviewed the patient's chart and labs.  Questions were answered to the patient's satisfaction.    Armandina Gemma, [facility] [place] Office: [place]

## 2022-01-28 ENCOUNTER — Encounter (HOSPITAL_COMMUNITY): Payer: Self-pay | Admitting: Surgery

## 2022-01-28 LAB — SURGICAL PATHOLOGY

## 2022-01-29 NOTE — Progress Notes (Signed)
Final result is a parathyroid adenoma.  Armandina Gemma, MD [facility] [place] [facility] Office: [phone]

## 2022-01-31 ENCOUNTER — Telehealth: Payer: Self-pay | Admitting: Internal Medicine

## 2022-01-31 DIAGNOSIS — R918 Other nonspecific abnormal finding of lung field: Secondary | ICD-10-CM

## 2022-01-31 MED ORDER — PREDNISONE 10 MG PO TABS: ORAL_TABLET | ORAL | 0 refills | Status: AC

## 2022-01-31 NOTE — Telephone Encounter (Signed)
Called and let patient know prednisone was sent in to [facility]. Also scheduled 2-3 week f/u ov and placed order for fu CXR. Pt aware to get CXR before appt. Nothing fruther needed

## 2022-02-06 DIAGNOSIS — I10 Essential (primary) hypertension: Secondary | ICD-10-CM | POA: Diagnosis not present

## 2022-02-06 DIAGNOSIS — N184 Chronic kidney disease, stage 4 (severe): Secondary | ICD-10-CM | POA: Diagnosis not present

## 2022-02-19 DIAGNOSIS — E210 Primary hyperparathyroidism: Secondary | ICD-10-CM | POA: Diagnosis not present

## 2022-02-19 DIAGNOSIS — E892 Postprocedural hypoparathyroidism: Secondary | ICD-10-CM | POA: Diagnosis not present

## 2022-02-24 ENCOUNTER — Ambulatory Visit (HOSPITAL_COMMUNITY)
Admission: RE | Admit: 2022-02-24 | Discharge: 2022-02-24 | Disposition: A | Discharge status: Home / Self Care | Payer: Medicare HMO | Source: Ambulatory Visit | Attending: Internal Medicine | Admitting: Internal Medicine

## 2022-02-24 ENCOUNTER — Encounter: Payer: Self-pay | Admitting: Internal Medicine

## 2022-02-24 ENCOUNTER — Other Ambulatory Visit: Payer: Self-pay

## 2022-02-24 ENCOUNTER — Ambulatory Visit: Payer: Medicare HMO | Admitting: Internal Medicine

## 2022-02-24 DIAGNOSIS — J189 Pneumonia, unspecified organism: Secondary | ICD-10-CM | POA: Diagnosis not present

## 2022-02-24 DIAGNOSIS — I10 Essential (primary) hypertension: Secondary | ICD-10-CM

## 2022-02-24 DIAGNOSIS — F17210 Nicotine dependence, cigarettes, uncomplicated: Secondary | ICD-10-CM | POA: Diagnosis not present

## 2022-02-24 DIAGNOSIS — R918 Other nonspecific abnormal finding of lung field: Secondary | ICD-10-CM | POA: Diagnosis not present

## 2022-02-24 DIAGNOSIS — R69 Illness, unspecified: Secondary | ICD-10-CM | POA: Diagnosis not present

## 2022-02-24 DIAGNOSIS — R0600 Dyspnea, unspecified: Secondary | ICD-10-CM | POA: Diagnosis not present

## 2022-02-24 MED ORDER — CLONIDINE HCL 0.1 MG PO TABS: 0.1000 mg | ORAL_TABLET | Freq: Two times a day (BID) | ORAL | 11 refills | Status: AC

## 2022-02-24 NOTE — Patient Instructions (Addendum)
Augmentin 875 mg take one pill twice daily  X 10 days - take at breakfast and supper with large glass of water.  It would help reduce the usual side effects (diarrhea and yeast infections) if you ate cultured yogurt at lunch.  ? ?Stop corevidol and start clonidine 0.1 mg twice daily should help your blood pressure and urge to smoke and anxiety related to stopping smoking  ? ?The key is to stop smoking completely before smoking completely stops you! ? ?Need to see dentist as soon as possible - this is a prioritiy  ?   ? ?Please schedule a follow up office visit in 6 weeks, call sooner if needed with cxr 1st  ?

## 2022-02-24 NOTE — Progress Notes (Signed)
? ?Stephanie [name], female    DOB: 1951-06-06,    MRN: [id number] ? ? ?Brief patient profile:  ?40  yobf  active smoker  referred to pulmonary clinic in [place]  01/06/2022 by Dr Stephanie [name] for abn ct   ? ? ? ? ?History of Present Illness  ?01/06/2022  Pulmonary/ 1st office eval/ Stephanie [name] / Stephanie [name] Office  ?Chief Complaint  ?Patient presents with  ? Consult  ?  Patient had CT scan done and said that she was told there was a spot on her lung. Denies shortness of breath but does have productive cough with tan color   ?Dyspnea:  housework ok / cross parking / [employer] walking slower walking not limited by doe  ?Cough: mucus is yellowish  worse x oct 2022  ?Sleep: bed is flat/ one pillow  ?SABA use: not using  ?Two bad upper teeth no recent dental work / no recent infections  ?Rec ?Augmentin 875 mg take one pill twice daily  X 10 days   ?See a dentist about getting your teeth pulled  ? go to the lab department  esr 76  wbc 13.4 >  rx pred x 6 d also  ? ? ?02/24/2022  f/u ov/Stephanie [name] office/Stephanie [name] re: RLL pna f/u cxr   ?Chief Complaint  ?Patient presents with  ? Follow-up  ?  Appt to discuss labs from 01/06/22(?) and CXR results.  ?CXR done this morning as instructed.   ?Dyspnea:  MMRC2 = can't walk a nl pace on a flat grade s sob but does fine slow and flat  ?Cough: most of the time beige, no blood  ?Sleeping: ok bed is flat 2 pillows  ?SABA use: not using  ?02: none  ?Covid status: vax x 4 ?  ? ? ?No obvious day to day or daytime variability or assoc excess/ purulent sputum or mucus plugs or hemoptysis or cp or chest tightness, subjective wheeze or overt sinus or hb symptoms.  ? ?sleeping without nocturnal  or early am exacerbation  of respiratory  c/o's or need for noct saba. Also denies any obvious fluctuation of symptoms with weather or environmental changes or other aggravating or alleviating factors except as outlined above  ? ?No unusual exposure hx or h/o childhood pna/ asthma or knowledge of premature birth. ? ?Current  Allergies, Complete Past Medical History, Past Surgical History, Family History, and Social History were reviewed in [employer] record. ? ?ROS  The following are not active complaints unless bolded ?Hoarseness, sore throat, dysphagia, dental problems, itching, sneezing,  nasal congestion or discharge of excess mucus or purulent secretions, ear ache,   fever, chills, sweats, unintended wt loss or wt gain, classically pleuritic or exertional cp,  orthopnea pnd or arm/hand swelling  or leg swelling, presyncope, palpitations, abdominal pain, anorexia, nausea, vomiting, diarrhea  or change in bowel habits or change in bladder habits, change in stools or change in urine, dysuria, hematuria,  rash, arthralgias, visual complaints, headache, numbness, weakness or ataxia or problems with walking or coordination,  change in mood= anxious  or  memory. ?      ? ?Current Meds  ?Medication Sig  ? allopurinol (ZYLOPRIM) 100 MG tablet Take 100 mg by mouth daily.  ? amLODipine (NORVASC) 10 MG tablet Take 10 mg by mouth daily.  ? aspirin EC 81 MG tablet Take 81 mg by mouth daily.  ? atorvastatin (LIPITOR) 40 MG tablet Take 40 mg by mouth daily.  ? carvedilol (COREG) 3.125 MG tablet  Take 3.125 mg by mouth 2 (two) times daily.  ? Cholecalciferol (VITAMIN D3 PO) Take 6,000 Units by mouth daily in the afternoon.  ? folic acid (FOLVITE) 1 MG tablet Take 1 mg by mouth daily.  ? losartan (COZAAR) 100 MG tablet Take 100 mg by mouth daily.  ? omeprazole (PRILOSEC) 20 MG capsule Take 20 mg by mouth daily.  ? traMADol (ULTRAM) 50 MG tablet Take 1-2 tablets (50-100 mg total) by mouth every 6 (six) hours as needed for moderate pain.  ?     ? ?  ?    ? ? ?Objective:  ?  ? ?Wt Readings from Last 3 Encounters:  ?02/24/22 115 lb 9.6 oz (52.4 kg)  ?01/21/22 111 lb (50.3 kg)  ?01/06/22 111 lb (50.3 kg)  ?  ? ? ?  ? ? 02/24/2022      115  ?01/06/22 111 lb (50.3 kg)  ?09/27/21 117 lb 12.8 oz (53.4 kg)  ?09/17/21 119 lb (54 kg)  ?  ?  Vital signs reviewed  02/24/2022  - Note at rest 02 sats  99% on RA  ? ?General appearance:    amb somber bf nad   ? ?HEENT : pt wearing mask not removed for exam due to covid - 19 concerns.  ? ?NECK :  without JVD/Nodes/TM/ nl carotid upstrokes bilaterally ? ? ?LUNGS: no acc muscle use,  Min barrel  contour chest wall with bilateral  slightly decreased bs s audible wheeze and  without cough on insp or exp maneuvers and min  Hyperresonant  to  percussion bilaterally   ? ? ?CV:  RRR  no s3 or murmur or increase in P2, and no edema  ? ?ABD:  soft and nontender with pos end  insp Hoover's  in the supine position. No bruits or organomegaly appreciated, bowel sounds nl ? ?MS:   Nl gait/  ext warm without deformities, calf tenderness, cyanosis or clubbing ?No obvious joint restrictions  ? ?SKIN: warm and dry without lesions   ? ?NEURO:  alert, approp, nl sensorium with  no motor or cerebellar deficits apparent.  ?    ? ?CXR PA and Lateral:   02/24/2022 :    ?I personally reviewed images and agree with radiology impression as follows:   Persisting right-sided pneumonia, slightly improved. ?My review: marked improvement esp on lateral view ? ?  ? ?  ? ?   ?Assessment  ? ?  ?   ?

## 2022-02-25 ENCOUNTER — Encounter: Payer: Self-pay | Admitting: Internal Medicine

## 2022-02-25 DIAGNOSIS — I10 Essential (primary) hypertension: Secondary | ICD-10-CM | POA: Insufficient documentation

## 2022-02-25 NOTE — Assessment & Plan Note (Addendum)
02/24/2022 Changed coreg to clonidine 0.1 mg bid due to anxiety and nicotine addiction  ? ?She is requesting anxiolytics and helps with stopping smoking to I rec trial of clonidine and f/u with PCP as planned  ? ? ?    ?  ? ?Each maintenance medication was reviewed in detail including emphasizing most importantly the difference between maintenance and prns and under what circumstances the prns are to be triggered using an action plan format where appropriate. ? ?Total time for H and P, chart review, counseling,  and generating customized AVS unique to this office visit / same day charting = 32 min  ?     ?

## 2022-02-25 NOTE — Assessment & Plan Note (Signed)
Active smoker ?- CT chest 11/11/21 with AS dz RLL sup segment  in pt with poor dentition new  since 11/09/20  ?- eval 01/06/2022 bad dentition so rec augmentin x 10 d then repeat the cxr and if no change > PET  ?- 01/06/22 crypto ag/ blasto ag neg  ?- ESR 01/06/22  =  76  With wbc 18k  ?- cxr 01/20/2022 still c/w organizing pna > rx Prednisone 10 mg take  4 each am x 2 days,   2 each am x 2 days,  1 each am x 2 days and stop and f/u with cxr in 3 weeks> def improvement 02/24/2022  ? ?Still coughing up some discolored mucus with ongoing dental issues so rec repeat augmentin x 10 d and f/u with dentist asap ? ?F/u here with cxr in 6 weeks  ?

## 2022-02-25 NOTE — Assessment & Plan Note (Addendum)
Counseled re importance of smoking cessation but did not meet time criteria for separate billing   °

## 2022-02-26 ENCOUNTER — Telehealth: Payer: Self-pay | Admitting: Internal Medicine

## 2022-02-27 MED ORDER — AMOXICILLIN-POT CLAVULANATE 875-125 MG PO TABS: 1.0000 | ORAL_TABLET | Freq: Two times a day (BID) | ORAL | 0 refills | Status: DC

## 2022-02-27 NOTE — Telephone Encounter (Signed)
Called and spoke with patient. She was seen by MW on 02/25/22 and thought an abx was to be sent to her pharmacy. Only the clonidine was sent in for her. I reviewed her OV note and MW did mention the Augmentin. I advised her I would go ahead and send this in for her. She verbalized understanding.  ? ?Nothing further needed at time of call.  ?

## 2022-03-03 DIAGNOSIS — I129 Hypertensive chronic kidney disease with stage 1 through stage 4 chronic kidney disease, or unspecified chronic kidney disease: Secondary | ICD-10-CM | POA: Diagnosis not present

## 2022-03-03 DIAGNOSIS — E559 Vitamin D deficiency, unspecified: Secondary | ICD-10-CM | POA: Diagnosis not present

## 2022-03-03 DIAGNOSIS — D511 Vitamin B12 deficiency anemia due to selective vitamin B12 malabsorption with proteinuria: Secondary | ICD-10-CM | POA: Diagnosis not present

## 2022-03-03 DIAGNOSIS — R809 Proteinuria, unspecified: Secondary | ICD-10-CM | POA: Diagnosis not present

## 2022-03-03 DIAGNOSIS — Z79899 Other long term (current) drug therapy: Secondary | ICD-10-CM | POA: Diagnosis not present

## 2022-03-03 DIAGNOSIS — N182 Chronic kidney disease, stage 2 (mild): Secondary | ICD-10-CM | POA: Diagnosis not present

## 2022-03-03 DIAGNOSIS — E210 Primary hyperparathyroidism: Secondary | ICD-10-CM | POA: Diagnosis not present

## 2022-03-08 DIAGNOSIS — I129 Hypertensive chronic kidney disease with stage 1 through stage 4 chronic kidney disease, or unspecified chronic kidney disease: Secondary | ICD-10-CM | POA: Diagnosis not present

## 2022-03-08 DIAGNOSIS — E210 Primary hyperparathyroidism: Secondary | ICD-10-CM | POA: Diagnosis not present

## 2022-03-08 DIAGNOSIS — N182 Chronic kidney disease, stage 2 (mild): Secondary | ICD-10-CM | POA: Diagnosis not present

## 2022-03-08 DIAGNOSIS — E538 Deficiency of other specified B group vitamins: Secondary | ICD-10-CM | POA: Diagnosis not present

## 2022-03-08 DIAGNOSIS — E559 Vitamin D deficiency, unspecified: Secondary | ICD-10-CM | POA: Diagnosis not present

## 2022-03-11 DIAGNOSIS — I10 Essential (primary) hypertension: Secondary | ICD-10-CM | POA: Diagnosis not present

## 2022-03-11 DIAGNOSIS — Z1331 Encounter for screening for depression: Secondary | ICD-10-CM | POA: Diagnosis not present

## 2022-03-11 DIAGNOSIS — R69 Illness, unspecified: Secondary | ICD-10-CM | POA: Diagnosis not present

## 2022-03-11 DIAGNOSIS — N184 Chronic kidney disease, stage 4 (severe): Secondary | ICD-10-CM | POA: Diagnosis not present

## 2022-03-11 DIAGNOSIS — J449 Chronic obstructive pulmonary disease, unspecified: Secondary | ICD-10-CM | POA: Diagnosis not present

## 2022-03-11 DIAGNOSIS — F17210 Nicotine dependence, cigarettes, uncomplicated: Secondary | ICD-10-CM | POA: Diagnosis not present

## 2022-03-11 DIAGNOSIS — Z23 Encounter for immunization: Secondary | ICD-10-CM | POA: Diagnosis not present

## 2022-03-11 DIAGNOSIS — Z0001 Encounter for general adult medical examination with abnormal findings: Secondary | ICD-10-CM | POA: Diagnosis not present

## 2022-03-11 DIAGNOSIS — Z1389 Encounter for screening for other disorder: Secondary | ICD-10-CM | POA: Diagnosis not present

## 2022-04-08 ENCOUNTER — Other Ambulatory Visit: Payer: Self-pay | Admitting: "Endocrinology

## 2022-04-08 DIAGNOSIS — E210 Primary hyperparathyroidism: Secondary | ICD-10-CM

## 2022-04-08 DIAGNOSIS — E559 Vitamin D deficiency, unspecified: Secondary | ICD-10-CM

## 2022-04-09 DIAGNOSIS — E559 Vitamin D deficiency, unspecified: Secondary | ICD-10-CM | POA: Diagnosis not present

## 2022-04-09 DIAGNOSIS — E210 Primary hyperparathyroidism: Secondary | ICD-10-CM | POA: Diagnosis not present

## 2022-04-10 ENCOUNTER — Ambulatory Visit: Payer: Medicare HMO | Admitting: "Endocrinology

## 2022-04-10 LAB — VITAMIN D 25 HYDROXY (VIT D DEFICIENCY, FRACTURES): Vit D, 25-Hydroxy: 76.6 ng/mL (ref 30.0–100.0)

## 2022-04-10 LAB — PTH, INTACT AND CALCIUM
Calcium: 9.8 mg/dL (ref 8.7–10.3)
PTH: 70 pg/mL — ABNORMAL HIGH (ref 15–65)

## 2022-04-10 LAB — T4, FREE: Free T4: 1.25 ng/dL (ref 0.82–1.77)

## 2022-04-10 LAB — TSH: TSH: 0.791 u[IU]/mL (ref 0.450–4.500)

## 2022-04-15 ENCOUNTER — Ambulatory Visit: Payer: Medicare HMO | Admitting: Internal Medicine

## 2022-04-16 DIAGNOSIS — N184 Chronic kidney disease, stage 4 (severe): Secondary | ICD-10-CM | POA: Diagnosis not present

## 2022-04-16 DIAGNOSIS — I10 Essential (primary) hypertension: Secondary | ICD-10-CM | POA: Diagnosis not present

## 2022-04-17 ENCOUNTER — Encounter: Payer: Self-pay | Admitting: "Endocrinology

## 2022-04-17 ENCOUNTER — Ambulatory Visit: Payer: Medicare HMO | Admitting: "Endocrinology

## 2022-04-17 VITALS — BP 126/58 | HR 56 | Ht 62.0 in | Wt 118.0 lb

## 2022-04-17 DIAGNOSIS — E210 Primary hyperparathyroidism: Secondary | ICD-10-CM | POA: Diagnosis not present

## 2022-04-17 DIAGNOSIS — E559 Vitamin D deficiency, unspecified: Secondary | ICD-10-CM | POA: Diagnosis not present

## 2022-04-17 NOTE — Progress Notes (Signed)
?     04/17/2022, 12:35 PM ? ?Endocrinology follow-up note ? ?Stephanie [name] is a 71 y.o.-year-old female, referred by her  [name], MD . ?She was seen here in consultation for hypercalcemia/hyperparathyroidism. ? ?She is here to follow-up with repeat labs, and bone density. ? ?Past Medical History:  ?Diagnosis Date  ? Anxiety   ? Arthritis   ? GERD (gastroesophageal reflux disease)   ? HTN (hypertension)   ? Hyperlipidemia   ? Hyperparathyroidism ([place])   ? Pneumonia   ? Vitamin D deficiency   ? ? ?Past Surgical History:  ?Procedure Laterality Date  ? APPENDECTOMY    ? COLONOSCOPY  09/03/2006  ? [id number] diverticulosis in the sigmoid colon.  Otherwise normal colon  ? COLONOSCOPY N/A 05/23/2016  ? Procedure: COLONOSCOPY;  Surgeon: Danie Binder, MD;  Location: [facility];  Service: Endoscopy;  Laterality: N/A;  1000  ? PARATHYROIDECTOMY Left 01/27/2022  ? Procedure: LEFT INFERIOR PARATHYROIDECTOMY;  Surgeon: Armandina Gemma, MD;  Location: [facility];  Service: General;  Laterality: Left;  ? ? ?Social History  ? ?Tobacco Use  ? Smoking status: Every Day  ?  Packs/day: 1.00  ?  Years: 20.00  ?  Pack years: 20.00  ?  Types: Cigarettes  ? Smokeless tobacco: Never  ? Tobacco comments:  ?  Smokes 1/2 pack a day [facility] 01/06/22  ?Vaping Use  ? Vaping Use: Never used  ?Substance Use Topics  ? Alcohol use: Yes  ?  Alcohol/week: 12.0 standard drinks  ?  Types: 6 Cans of beer, 6 Standard drinks or equivalent per week  ?  Comment: 6 pack in a week  ? Drug use: Yes  ?  Frequency: 1.0 times per week  ?  Types: Marijuana  ? ? ?Family History  ?Problem Relation Age of Onset  ? Hypertension Mother   ? Hyperlipidemia Mother   ? Hypertension Father   ? Colon cancer Neg Hx   ? Colon polyps Neg Hx   ? Liver disease Neg Hx   ? ? ?Outpatient Encounter Medications as of 04/17/2022  ?Medication Sig  ? allopurinol (ZYLOPRIM) 100 MG tablet Take 100 mg by mouth daily.  ? amLODipine (NORVASC) 10 MG tablet Take 10 mg by mouth daily.  ?  aspirin EC 81 MG tablet Take 81 mg by mouth daily.  ? atorvastatin (LIPITOR) 40 MG tablet Take 40 mg by mouth daily.  ? Cholecalciferol (VITAMIN D3 PO) Take 1,000 Units by mouth daily in the afternoon.  ? cloNIDine (CATAPRES) 0.1 MG tablet Take 1 tablet (0.1 mg total) by mouth 2 (two) times daily.  ? folic acid (FOLVITE) 1 MG tablet Take 1 mg by mouth daily.  ? losartan (COZAAR) 100 MG tablet Take 100 mg by mouth daily.  ? omeprazole (PRILOSEC) 20 MG capsule Take 20 mg by mouth daily.  ? traMADol (ULTRAM) 50 MG tablet Take 1-2 tablets (50-100 mg total) by mouth every 6 (six) hours as needed for moderate pain.  ? [DISCONTINUED] amoxicillin-clavulanate (AUGMENTIN) 875-125 MG tablet Take 1 tablet by mouth 2 (two) times daily.  ? ?No facility-administered encounter medications on file as of 04/17/2022.  ? ? ?No Known Allergies ? ? ?HPI  ?Stephanie [name] was diagnosed with hypercalcemia in 2021.  She did have several instances of elevated calcium in the last year.  After appropriate work-up indicated primary hyperparathyroidism, she was sent for parathyroidectomy which she underwent on January 27, 2022.  Surgical pathology showed hypercellular parathyroid consistent  with adenoma left inferior was removed.  Her subsequent labs show correction of calcium to 9.8, however PTH remains high at 70.   ? ?She is recovering from surgery related ? Her bone density from September 25, 2021 shows osteopenia on the hips and forearms. ? ?No prior history of fragility fractures or falls. No history of  kidney stones. ? ? She has normal renal function ? ?she is not on HCTZ or other thiazide therapy.  She has vitamin D deficiency, currently on vitamin D3 1000 units daily.,  After her recent labs showed repeat vitamin D at 36. ?she is not on calcium supplements,  she eats dairy and green, leafy, vegetables on average amounts. ? ?she does not have a family history of hypercalcemia, pituitary tumors, thyroid cancer, or osteoporosis.  She  denies any height loss.  Her medical history includes alcohol abuse, her magnesium status not known. ? ? ? ?ROS: ? ?Constitutional: + Minimally fluctuating body weight,  no fatigue, no subjective hyperthermia, no subjective hypothermia ? ? ?PE: ?BP (!) 126/58   Pulse (!) 56   Ht '5\' 2"'$  (1.575 m)   Wt 118 lb (53.5 kg)   BMI 21.58 kg/m? , Body mass index is 21.58 kg/m?. ?Wt Readings from Last 3 Encounters:  ?04/17/22 118 lb (53.5 kg)  ?02/24/22 115 lb 9.6 oz (52.4 kg)  ?01/21/22 111 lb (50.3 kg)  ?  ?Constitutional: + BMI of 26.6, not in acute distress, normal state of mind ?Eyes: PERRLA, EOMI, no exophthalmos ?ENT: moist mucous membranes, no gross thyromegaly, no gross cervical lymphadenopathy ? ? ?Recent Results (from the past 2160 hour(s))  ?Basic metabolic panel per protocol     Status: Abnormal  ? Collection Time: 01/21/22 12:40 PM  ?Result Value Ref Range  ? Sodium 134 (L) 135 - 145 mmol/L  ? Potassium 4.3 3.5 - 5.1 mmol/L  ? Chloride 101 98 - 111 mmol/L  ? CO2 27 22 - 32 mmol/L  ? Glucose, Bld 96 70 - 99 mg/dL  ?  Comment: Glucose reference range applies only to samples taken after fasting for at least 8 hours.  ? BUN 14 8 - 23 mg/dL  ? Creatinine, Ser 0.53 0.44 - 1.00 mg/dL  ? Calcium 10.0 8.9 - 10.3 mg/dL  ? GFR, Estimated >60 >60 mL/min  ?  Comment: (NOTE) ?Calculated using the CKD-EPI Creatinine Equation (2021) ?  ? Anion gap 6 5 - 15  ?  Comment: Performed at [facility], [place] [address] [place], [place] [address]  ?Surgical pathology     Status: None  ? Collection Time: 01/27/22  8:02 AM  ?Result Value Ref Range  ? SURGICAL PATHOLOGY    ?  SURGICAL PATHOLOGY ?CASE: [id number] ?PATIENT: Stephanie [name] ?Surgical Pathology Report ? ? ? ? ?Clinical History: Primary hyperparathyroidism ([facility]) ? ? ? ? ?FINAL MICROSCOPIC DIAGNOSIS: ? ?A. LEFT INFERIOR PARATHYROID, ADENOMA, PARATHYROIDECTOMY: ? ?Hypercellular parathyroid consistent with adenoma ? ?INTRAOPERATIVE DIAGNOSIS: ? ?A.  Parathyroid, Left Inferior, Adenoma: "Hypercellular parathyroid ?consistent with adenoma." ?Frozen diagnosis rendered by Dr. Saralyn Pilar at 08:21 on 27 January 2022. ? ? ?GROSS DESCRIPTION: ? ?A. Received fresh for intraoperative consultation labeled with the ?patient's name and "Left inferior parathyroid" is a 1.461 g, 2.9 x 1.3 x ?1.2 cm red-tan, rubbery piece of soft tissue. After bisecting, one piece ?is placed on a single chuck for frozen section diagnosis and ?subsequently in cassette A1 for permanent. The remaining tissue is ?submitted in cassette A2 for routine histology. ? ?(LEF  01/27/2022) ? ? ? ?Final Diagnosis performed by Tobin Chad, MD .   Electronically ?signed 01/28/2022 ?Technical component performed at [facility], [place] ?[address] [place], [place] [address]. ? Professional component performed at [facility]. [place] [address], [place], [place] [address]. ? Immunohistochemistry Technical component (if applicable) was performed ?at [facility]. [address], [address], ?[place], [place] [address].   IMMUNOHISTOCHEMISTRY DISCLAIMER (if applicable): ?Some of these immunohistochemical stains may have been developed and the ?performance characteristics determine by [facility]. Some ?may not have been cleared or approved by the U.S. Food and Drug ?Administration. The FDA has determined that such clearance or approval ?is not necessary. This test is used for clinical purposes. It should not ?be regarded as investigational or for research. This laboratory is ?certified under the Clinical Laboratory Improvement Amendments of 1988 ?(CLIA-88) as qualified t o perform high complexity clinical laboratory ?testing.  The controls stained appropriately. ?  ?PTH, Intact and Calcium     Status: Abnormal  ? Collection Time: 04/09/22  8:37 AM  ?Result Value Ref Range  ? Calcium 9.8 8.7 - 10.3 mg/dL  ? PTH 70 (H) 15 - 65 pg/mL  ? PTH Interp Comment    ?  Comment: Interpretation                 Intact PTH    Calcium ?                                (pg/mL)      (mg/dL) ?Normal                          15 - 65     8.6 - 10.2 ?Primary Hyperparathyroidism         >65

## 2022-05-17 DIAGNOSIS — E785 Hyperlipidemia, unspecified: Secondary | ICD-10-CM | POA: Diagnosis not present

## 2022-05-17 DIAGNOSIS — I10 Essential (primary) hypertension: Secondary | ICD-10-CM | POA: Diagnosis not present

## 2022-05-23 ENCOUNTER — Other Ambulatory Visit: Payer: Self-pay

## 2022-05-23 DIAGNOSIS — R918 Other nonspecific abnormal finding of lung field: Secondary | ICD-10-CM

## 2022-05-27 ENCOUNTER — Ambulatory Visit: Payer: Medicare HMO | Admitting: Internal Medicine

## 2022-05-29 IMAGING — US US THYROID
1 series · 13 of 25 positions shown · non-contrast
Comparison: Nuclear medicine parathyroid study April 2021

CLINICAL DATA: Follow-up parathyroid study

EXAM:
THYROID ULTRASOUND
TECHNIQUE: Ultrasound examination of the thyroid gland and adjacent soft
tissues was performed.

[Series 1: us thyroid · 0.08mm/px · 51 acquisitions, 13 frames shown]
[im 1/51]
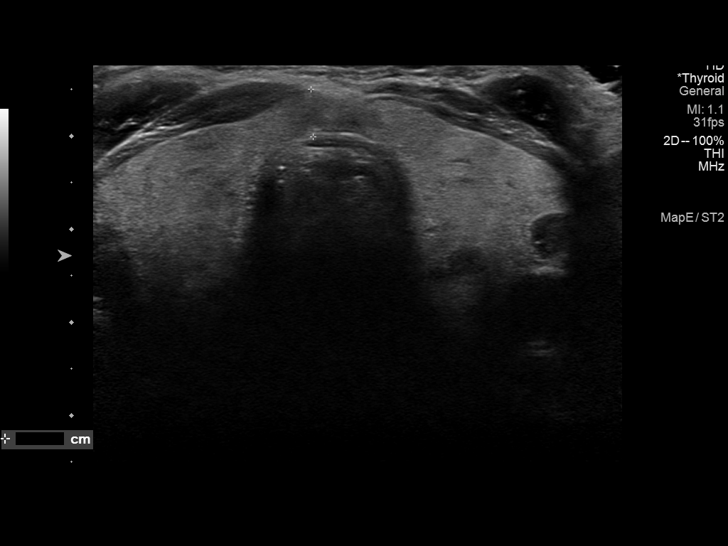
[im 5/51]
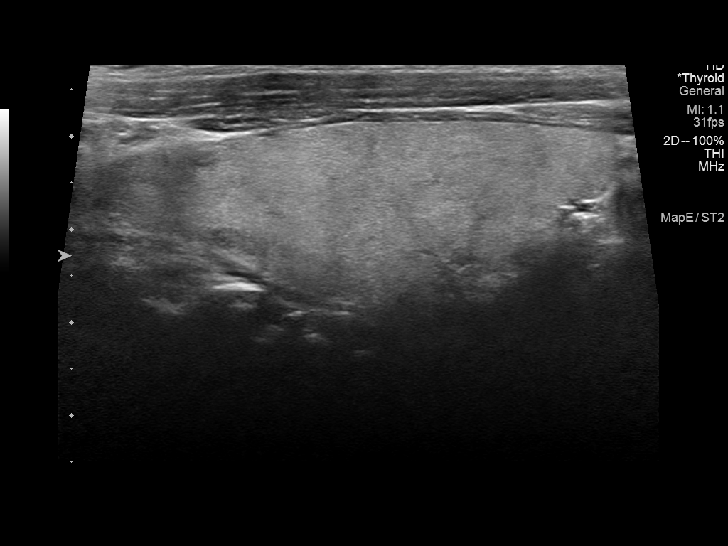
[im 9/51]
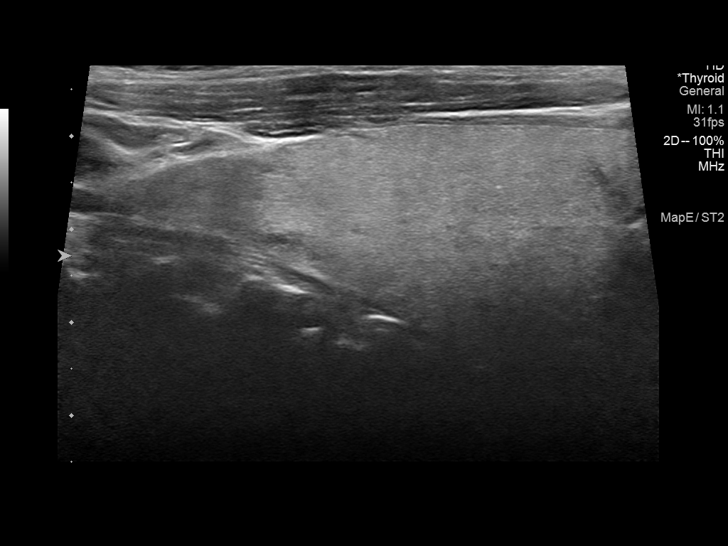
[im 13/51]
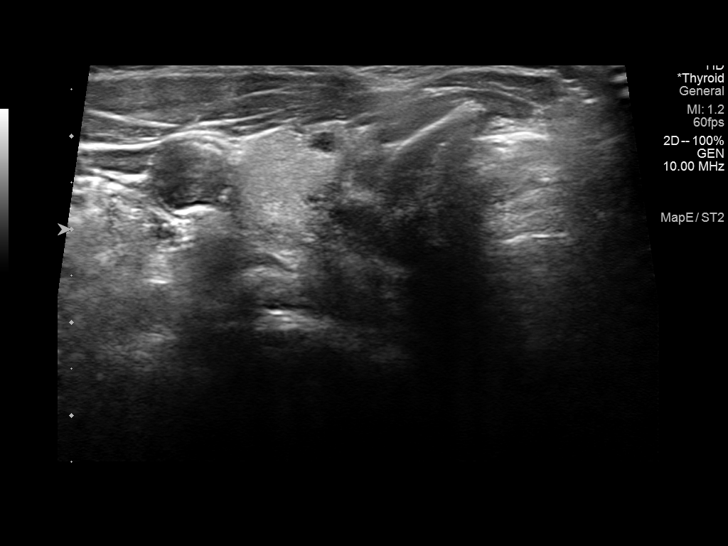
[im 17/51]
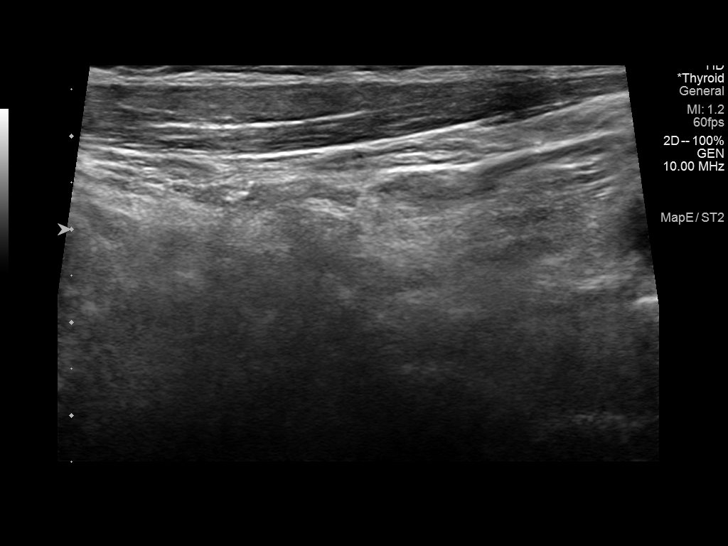
[im 21/51]
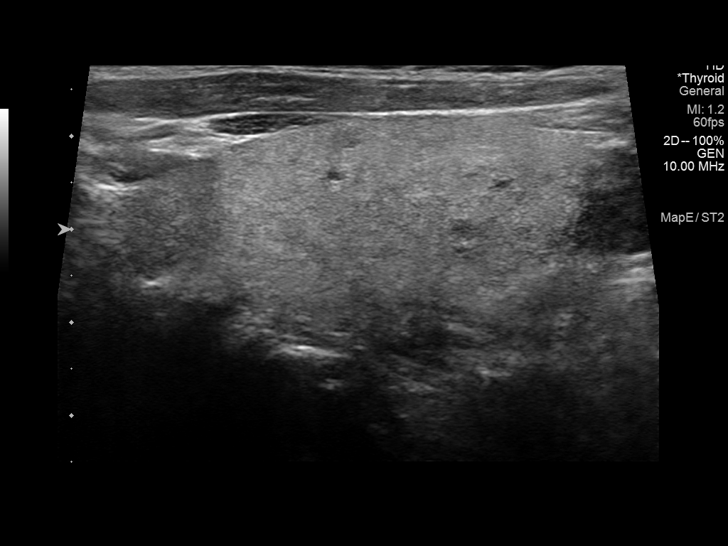
[im 26/51]
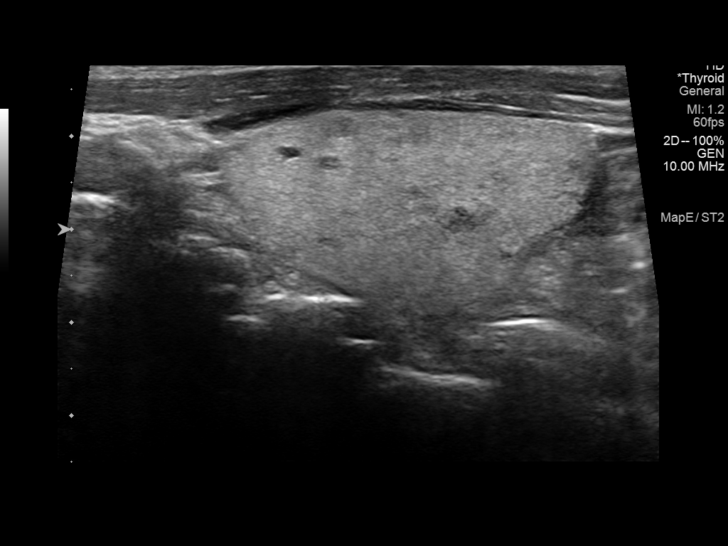
[im 30/51]
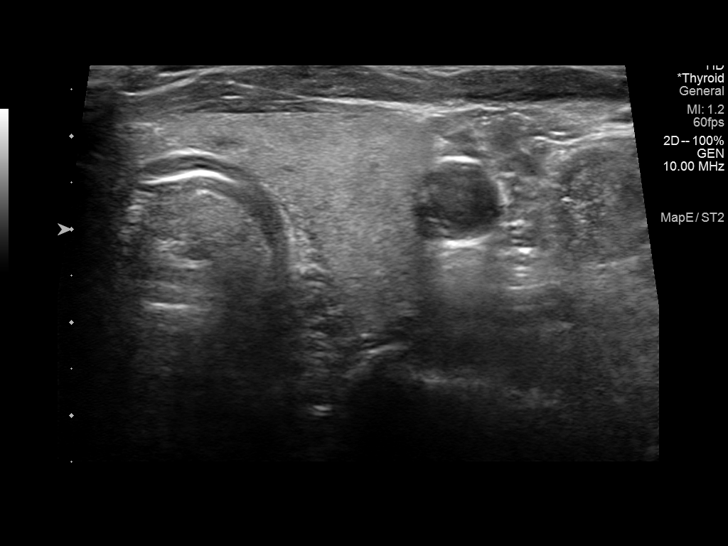
[im 34/51]
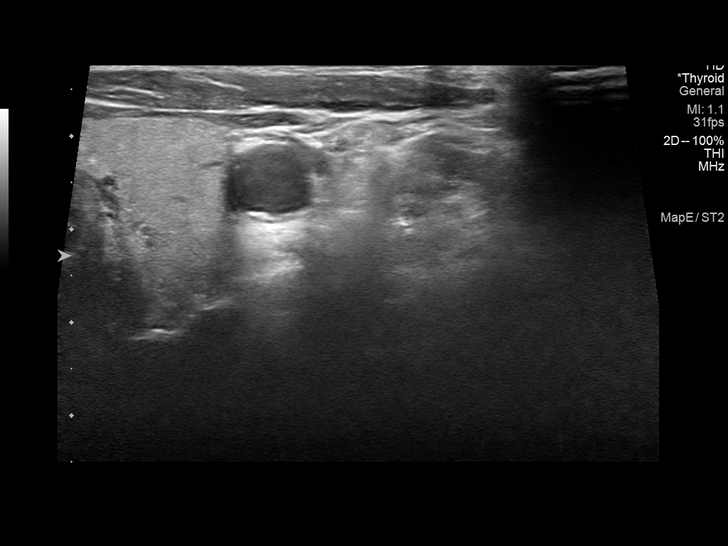
[im 38/51]
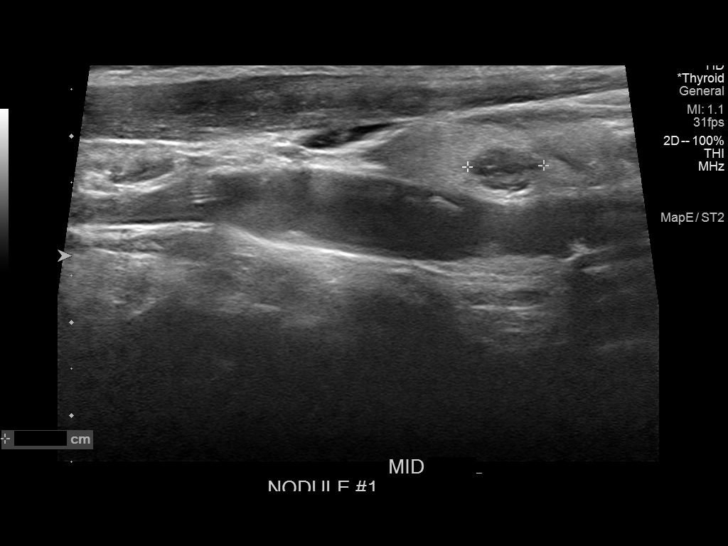
[im 42/51]
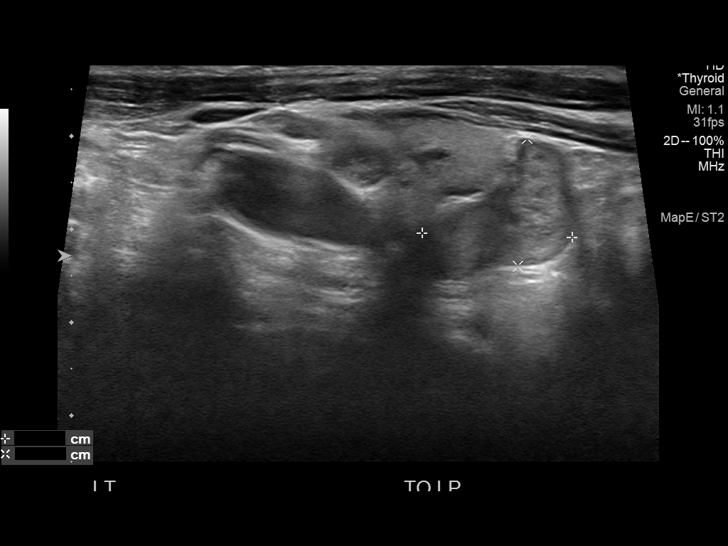
[im 46/51]
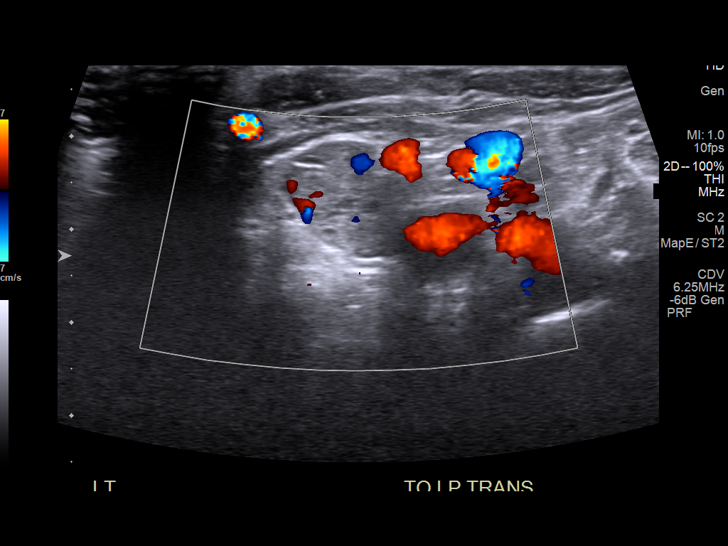
[im 51/51]
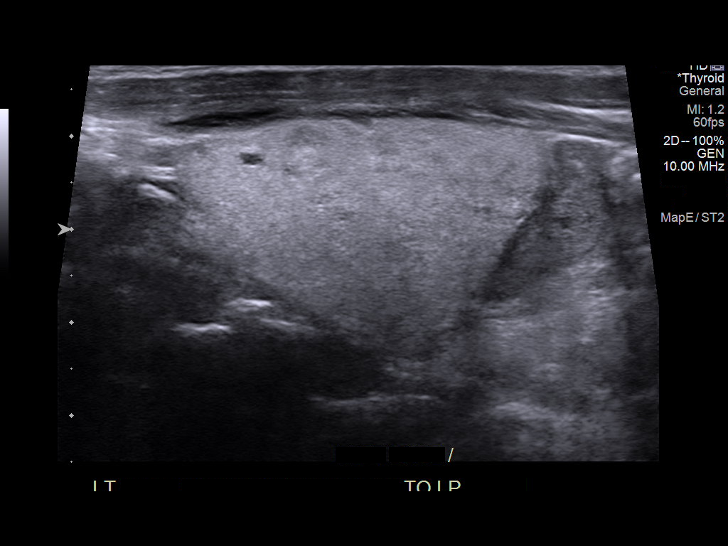

[13 of 25 positions shown; findings below may reference images not displayed]

FINDINGS: Parenchymal Echotexture: Mildly heterogenous

Isthmus: 0.5 cm

Right lobe: 5.7 x 2.0 x 1.8 cm

Left lobe: 5.5 x 2.3 x 1.7 cm

_________________________________________________________

Estimated total number of nodules >/= 1 cm: 0

Number of spongiform nodules >/=  2 cm not described below (TR1): 0

Number of mixed cystic and solid nodules >/= 1.5 cm not described
below (TR2): 0

_________________________________________________________

In the left thyroid lobe, there is a subcentimeter spongiform
appearing nodule. This nodule does NOT meet TI-RADS criteria for
biopsy or dedicated follow-up.

Inferior to the left thyroid lobe, there is an irregularly shaped
lesion that is hyperechoic to the adjacent cervical musculature and
measures 1.6 x 1.4 x 1.0 cm. This corresponds to the location of the
abnormality identified on recent nuclear medicine parathyroid study.
IMPRESSION: 1. Inferior to the left thyroid lobe, there is an irregularly shaped
lesion that measures 1.6 x 1.4 x 1.0 cm, and corresponds to the
location of the parathyroid adenoma identified on recent nuclear
medicine parathyroid study.
2. Enlarged but otherwise normal appearance of the thyroid gland
without discrete nodule.

## 2022-06-16 DIAGNOSIS — K219 Gastro-esophageal reflux disease without esophagitis: Secondary | ICD-10-CM | POA: Diagnosis not present

## 2022-06-16 DIAGNOSIS — I10 Essential (primary) hypertension: Secondary | ICD-10-CM | POA: Diagnosis not present

## 2022-07-10 ENCOUNTER — Encounter: Payer: Self-pay | Admitting: Internal Medicine

## 2022-07-10 ENCOUNTER — Ambulatory Visit: Payer: Medicare HMO | Admitting: Internal Medicine

## 2022-07-10 ENCOUNTER — Ambulatory Visit (HOSPITAL_COMMUNITY)
Admission: RE | Admit: 2022-07-10 | Discharge: 2022-07-10 | Disposition: A | Discharge status: Home / Self Care | Payer: Medicare HMO | Source: Ambulatory Visit | Attending: Internal Medicine | Admitting: Internal Medicine

## 2022-07-10 DIAGNOSIS — I10 Essential (primary) hypertension: Secondary | ICD-10-CM | POA: Diagnosis not present

## 2022-07-10 DIAGNOSIS — R918 Other nonspecific abnormal finding of lung field: Secondary | ICD-10-CM | POA: Diagnosis not present

## 2022-07-10 DIAGNOSIS — F17210 Nicotine dependence, cigarettes, uncomplicated: Secondary | ICD-10-CM

## 2022-07-10 DIAGNOSIS — J189 Pneumonia, unspecified organism: Secondary | ICD-10-CM | POA: Diagnosis not present

## 2022-07-10 DIAGNOSIS — R69 Illness, unspecified: Secondary | ICD-10-CM | POA: Diagnosis not present

## 2022-07-10 DIAGNOSIS — R0600 Dyspnea, unspecified: Secondary | ICD-10-CM | POA: Diagnosis not present

## 2022-07-10 NOTE — Patient Instructions (Signed)
Remember to see your dentist and stop all smoking if you can  Follow up with annual CT planning    If you are satisfied with your treatment plan,  let your doctor know and he/she can either refill your medications or you can return here when your prescription runs out.     If in any way you are not 100% satisfied,  please tell us.  If 100% better, tell your friends!  Pulmonary follow up is as needed

## 2022-07-10 NOTE — Progress Notes (Signed)
Stephanie [name], female    DOB: July 20, 1951,    MRN: [id number]   Brief patient profile:  45  yobf  active smoker  referred to pulmonary clinic in [name]  01/06/2022 by Dr Legrand Rams for abn ct       History of Present Illness  01/06/2022  Pulmonary/ 1st office eval/ Stephanie [name] / [name]  Chief Complaint  Patient presents with   Consult    Patient had CT scan done and said that she was told there was a spot on her lung. Denies shortness of breath but does have productive cough with tan color   Dyspnea:  housework ok / cross parking / [employer] walking slower walking not limited by doe  Cough: mucus is yellowish  worse x oct 2022  Sleep: bed is flat/ one pillow  SABA use: not using  Two bad upper teeth no recent dental work / no recent infections  Rec Augmentin 875 mg take one pill twice daily  X 10 days   See a dentist about getting your teeth pulled   go to the lab department  esr 76  wbc 13.4 >  rx pred x 6 d also    02/24/2022  f/u ov/[name] office/Stephanie [name] re: RLL pna f/u cxr   Chief Complaint  Patient presents with   Follow-up    Appt to discuss labs from 01/06/22(?) and CXR results.  CXR done this morning as instructed.   Dyspnea:  MMRC2 = can't walk a nl pace on a flat grade s sob but does fine slow and flat  Cough: most of the time beige, no blood  Sleeping: ok bed is flat 2 pillows  SABA use: not using  02: none  Covid status: vax x 4 Rec Augmentin 875 mg take one pill twice daily  X 10 days - take at breakfast and supper with large glass of water.  Stop corevidol and start clonidine 0.1 mg twice daily should help your blood pressure and urge to smoke and anxiety related to stopping smoking  The key is to stop smoking completely before smoking completely stops you!  Need to see dentist as soon as possible  > not done as of 07/10/2022      07/10/2022  f/u ov/[place] office/Stephanie [name] re: probable asp pna/ bad teethy maint on none   Chief Complaint  Patient presents  with   Follow-up    Went over CXR results with patient  Feels breathing has improved.   Dyspnea:  MMRC2 = can't walk a nl pace on a flat grade s sob but does fine slow and flat Cough: gone  Sleeping: flat bed/ 2 pillows  SABA use: not using  02: none  Covid status: vax x 3 or 4    No obvious day to day or daytime variability or assoc excess/ purulent sputum or mucus plugs or hemoptysis or cp or chest tightness, subjective wheeze or overt sinus or hb symptoms.   Sleeping now  without nocturnal  or early am exacerbation  of respiratory  c/o's or need for noct saba. Also denies any obvious fluctuation of symptoms with weather or environmental changes or other aggravating or alleviating factors except as outlined above   No unusual exposure hx or h/o childhood pna/ asthma or knowledge of premature birth.  Current Allergies, Complete Past Medical History, Past Surgical History, Family History, and Social History were reviewed in [employer] record.  ROS  The following are not active complaints unless bolded Hoarseness,  sore throat, dysphagia, dental problems, itching, sneezing,  nasal congestion or discharge of excess mucus or purulent secretions, ear ache,   fever, chills, sweats, unintended wt loss or wt gain, classically pleuritic or exertional cp,  orthopnea pnd or arm/hand swelling  or leg swelling, presyncope, palpitations, abdominal pain, anorexia, nausea, vomiting, diarrhea  or change in bowel habits or change in bladder habits, change in stools or change in urine, dysuria, hematuria,  rash, arthralgias, visual complaints, headache, numbness, weakness or ataxia or problems with walking or coordination,  change in mood or  memory.        Current Meds  Medication Sig   allopurinol (ZYLOPRIM) 100 MG tablet Take 100 mg by mouth daily.   amLODipine (NORVASC) 10 MG tablet Take 10 mg by mouth daily.   aspirin EC 81 MG tablet Take 81 mg by mouth daily.   atorvastatin  (LIPITOR) 40 MG tablet Take 40 mg by mouth daily.   Cholecalciferol (VITAMIN D3 PO) Take 1,000 Units by mouth daily in the afternoon.   cloNIDine (CATAPRES) 0.1 MG tablet Take 1 tablet (0.1 mg total) by mouth 2 (two) times daily.   folic acid (FOLVITE) 1 MG tablet Take 1 mg by mouth daily.   losartan (COZAAR) 100 MG tablet Take 100 mg by mouth daily.   omeprazole (PRILOSEC) 20 MG capsule Take 20 mg by mouth daily.                Objective:     Wts  07/10/2022         119   02/24/2022      115  01/06/22 111 lb (50.3 kg)  09/27/21 117 lb 12.8 oz (53.4 kg)  09/17/21 119 lb (54 kg)      HEENT : Oropharynx  clear/ poor dentition   Nasal turbinates nl    NECK :  without  apparent JVD/ palpable Nodes/TM    LUNGS: no acc muscle use,  Min barrel  contour chest wall with bilateral  slightly decreased bs s audible wheeze and  without cough on insp or exp maneuvers and min  Hyperresonant  to  percussion bilaterally    CV:  RRR  no s3 or murmur or increase in P2, and no edema   ABD:  soft and nontender with pos end  insp Hoover's  in the supine position.  No bruits or organomegaly appreciated   MS:  Nl gait/ ext warm without deformities Or obvious joint restrictions  calf tenderness, cyanosis or clubbing     SKIN: warm and dry without lesions    NEURO:  alert, approp, nl sensorium with  no motor or cerebellar deficits apparent.           CXR PA and Lateral:   07/10/2022 :    I personally reviewed images and agree with radiology impression as follows:   Previously noted right mid to lung base pneumonia is improved with slight small patchy opacity persistent.          Assessment

## 2022-07-10 NOTE — Assessment & Plan Note (Signed)
Counseled re importance of smoking cessation but did not meet time criteria for separate billing           Each maintenance medication was reviewed in detail including emphasizing most importantly the difference between maintenance and prns and under what circumstances the prns are to be triggered using an action plan format where appropriate.  Total time for H and P, chart review, counseling,  and generating customized AVS unique to this office visit / same day charting = 20 min

## 2022-07-10 NOTE — Assessment & Plan Note (Addendum)
Active smoker - CT chest 11/11/21 with AS dz RLL sup segment  in pt with poor dentition new  since 11/09/20  - eval 01/06/2022 bad dentition so rec augmentin x 10 d then repeat the cxr and if no change > PET  - 01/06/22 crypto ag/ blasto ag neg  - ESR 01/06/22  =  76  With wbc 18k  - cxr 01/20/2022 still c/w organizing pna > rx Prednisone 10 mg take  4 each am x 2 days,   2 each am x 2 days,  1 each am x 2 days and stop and f/u with cxr in 3 weeks - chest X-ray 07/10/2022 marked serial improvement with min residual > f/u prn  In pulmonary clinic but advised to keep up with annual LDST due each December per PCP  >>> see dentist  (planned already)

## 2022-07-10 NOTE — Assessment & Plan Note (Signed)
02/24/2022 Changed coreg to clonidine due to anxiety and nicotine addiction > improved 07/10/2022   Adequate control on present rx, reviewed in detail with pt > no change in rx needed  > f/u per PCP

## 2022-07-17 DIAGNOSIS — E782 Mixed hyperlipidemia: Secondary | ICD-10-CM | POA: Diagnosis not present

## 2022-07-17 DIAGNOSIS — I10 Essential (primary) hypertension: Secondary | ICD-10-CM | POA: Diagnosis not present

## 2022-07-21 IMAGING — DX DG CHEST 2V
2 series · 2 of 2 positions shown · non-contrast
Comparison: 01/20/2022

CLINICAL DATA: 71-year-old female with dyspnea on exertion

EXAM:
CHEST - 2 VIEW

[chest pa]
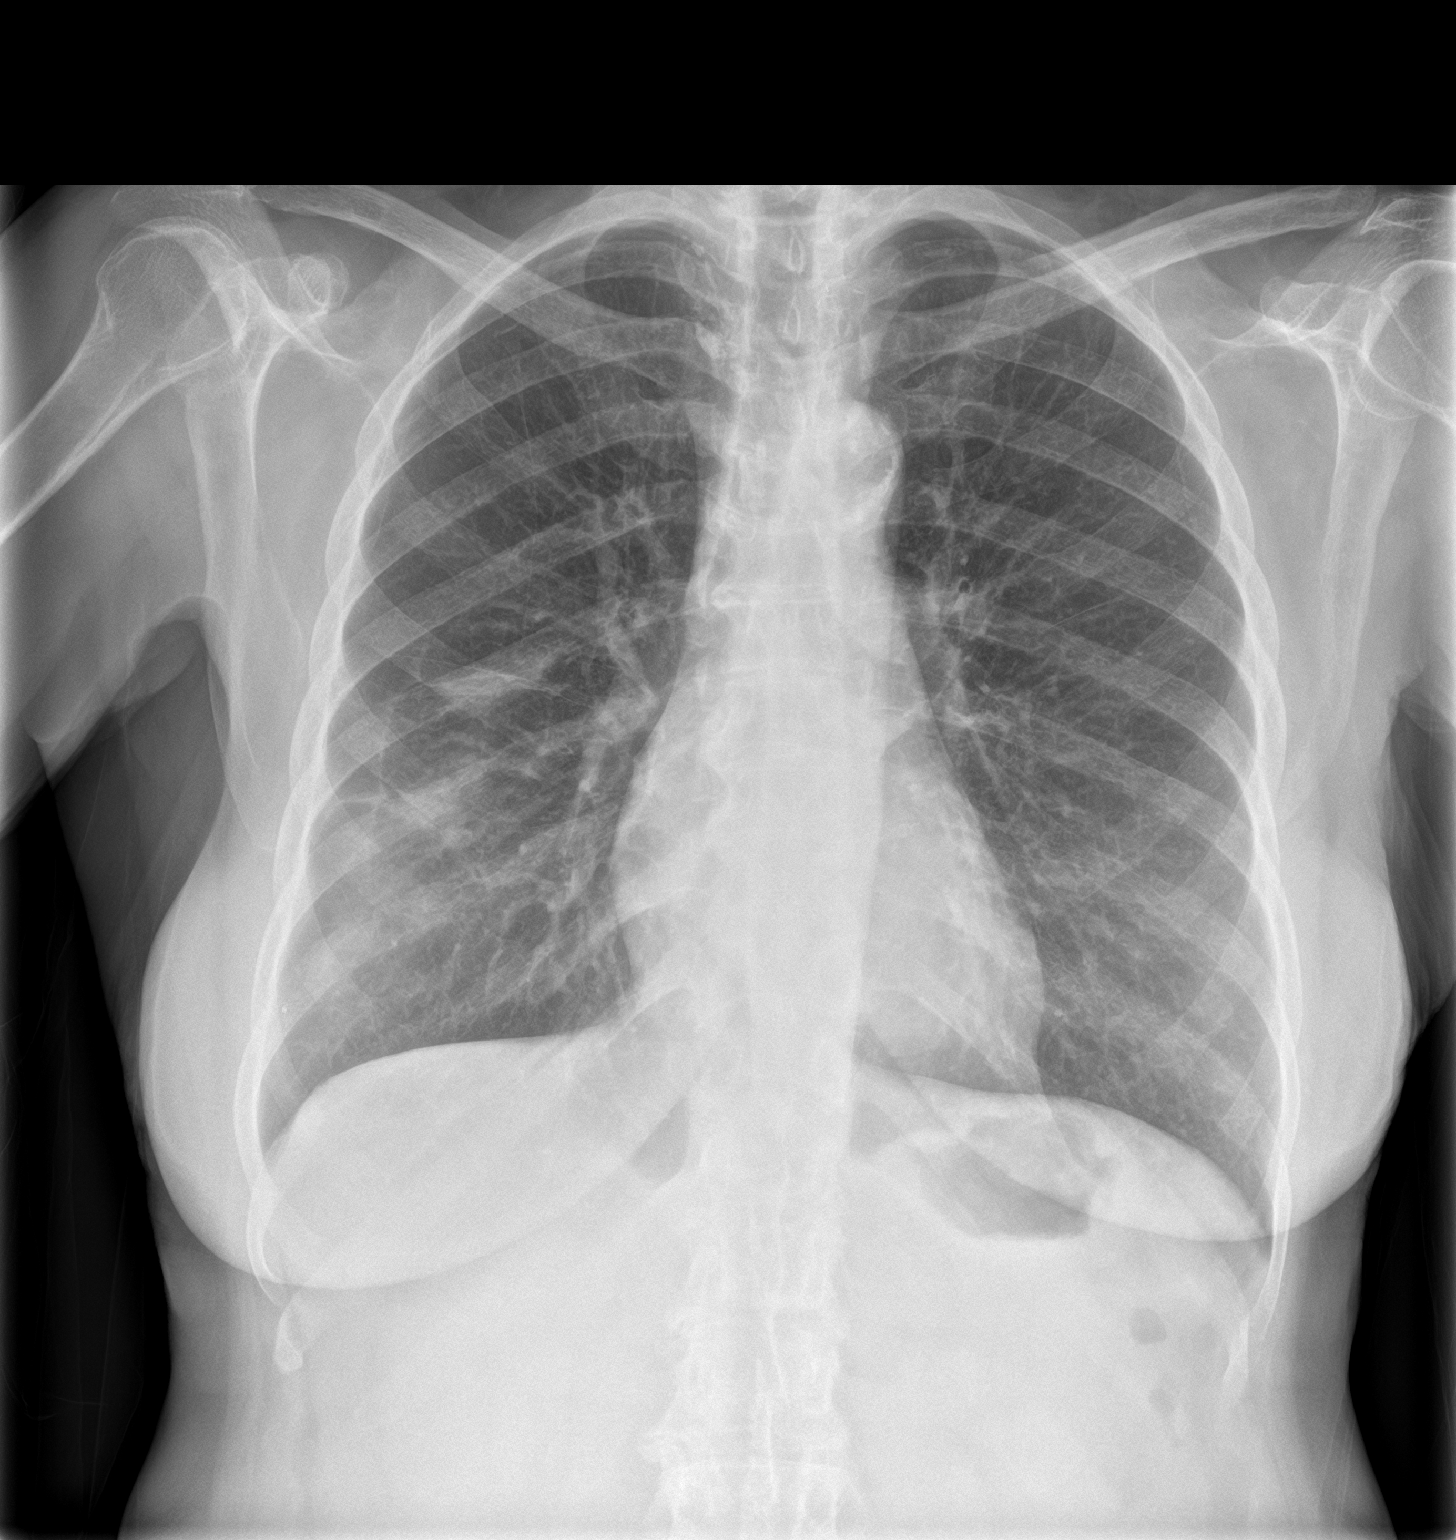

[chest lat]
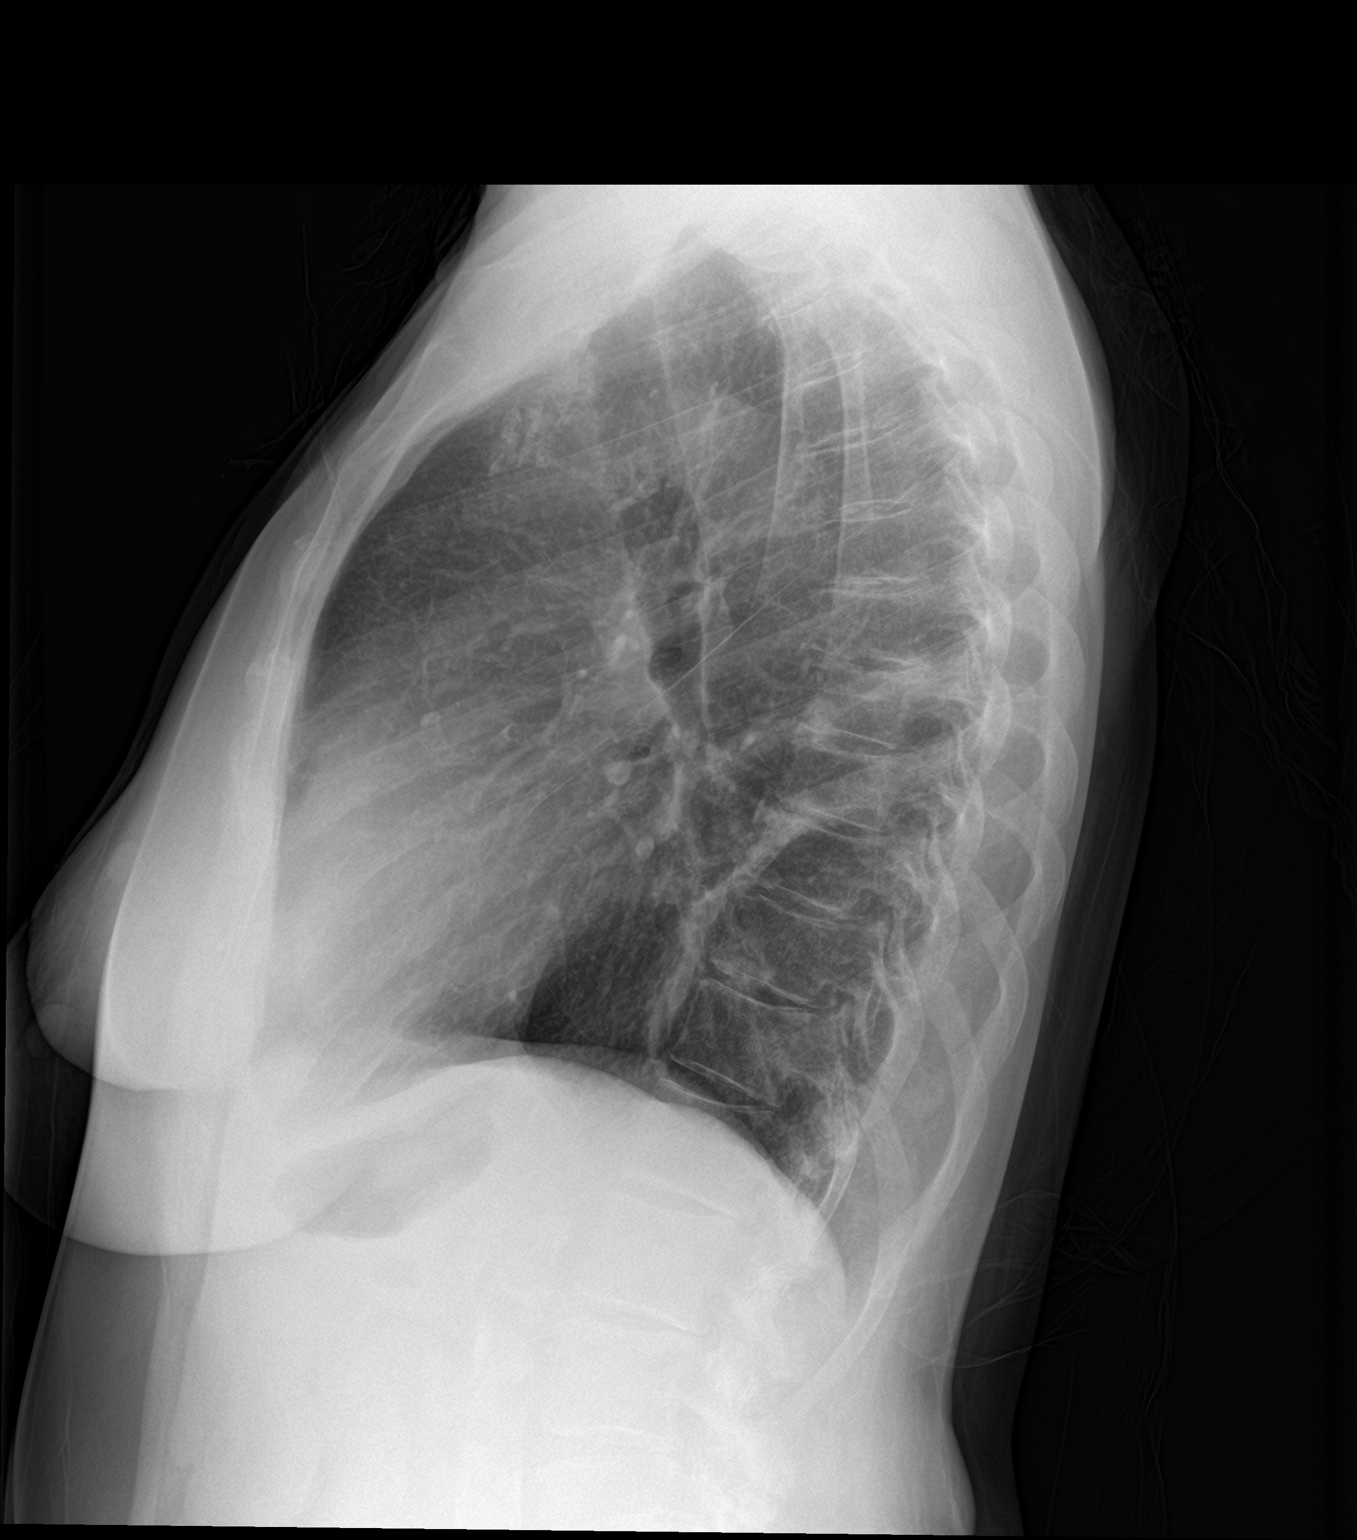

[2 of 2 positions shown; findings below may reference images not displayed]

FINDINGS: Cardiomediastinal silhouette unchanged in size and contour.

Patchy airspace disease in the right mid and lower lung, slightly
improved from the comparison. Coarsened interstitial markings. No
pneumothorax or pleural effusion.

Degenerative changes spine.  No displaced fracture.
IMPRESSION: Persisting right-sided pneumonia, slightly improved.

## 2022-08-17 DIAGNOSIS — K219 Gastro-esophageal reflux disease without esophagitis: Secondary | ICD-10-CM | POA: Diagnosis not present

## 2022-08-17 DIAGNOSIS — I10 Essential (primary) hypertension: Secondary | ICD-10-CM | POA: Diagnosis not present

## 2022-09-16 DIAGNOSIS — E785 Hyperlipidemia, unspecified: Secondary | ICD-10-CM | POA: Diagnosis not present

## 2022-09-16 DIAGNOSIS — I10 Essential (primary) hypertension: Secondary | ICD-10-CM | POA: Diagnosis not present

## 2022-10-14 DIAGNOSIS — E210 Primary hyperparathyroidism: Secondary | ICD-10-CM | POA: Diagnosis not present

## 2022-10-15 LAB — PTH, INTACT AND CALCIUM
Calcium: 9.6 mg/dL (ref 8.7–10.3)
PTH: 43 pg/mL (ref 15–65)

## 2022-10-16 DIAGNOSIS — N182 Chronic kidney disease, stage 2 (mild): Secondary | ICD-10-CM | POA: Diagnosis not present

## 2022-10-16 DIAGNOSIS — E210 Primary hyperparathyroidism: Secondary | ICD-10-CM | POA: Diagnosis not present

## 2022-10-16 DIAGNOSIS — E538 Deficiency of other specified B group vitamins: Secondary | ICD-10-CM | POA: Diagnosis not present

## 2022-10-16 DIAGNOSIS — E559 Vitamin D deficiency, unspecified: Secondary | ICD-10-CM | POA: Diagnosis not present

## 2022-10-16 DIAGNOSIS — I129 Hypertensive chronic kidney disease with stage 1 through stage 4 chronic kidney disease, or unspecified chronic kidney disease: Secondary | ICD-10-CM | POA: Diagnosis not present

## 2022-10-17 DIAGNOSIS — I10 Essential (primary) hypertension: Secondary | ICD-10-CM | POA: Diagnosis not present

## 2022-10-17 DIAGNOSIS — E785 Hyperlipidemia, unspecified: Secondary | ICD-10-CM | POA: Diagnosis not present

## 2022-10-20 ENCOUNTER — Ambulatory Visit: Payer: Medicare HMO | Admitting: "Endocrinology

## 2022-10-20 ENCOUNTER — Encounter: Payer: Self-pay | Admitting: "Endocrinology

## 2022-10-20 VITALS — BP 126/66 | HR 88 | Ht 62.0 in | Wt 120.8 lb

## 2022-10-20 DIAGNOSIS — E210 Primary hyperparathyroidism: Secondary | ICD-10-CM

## 2022-10-20 DIAGNOSIS — E559 Vitamin D deficiency, unspecified: Secondary | ICD-10-CM

## 2022-10-20 NOTE — Progress Notes (Signed)
10/20/2022, 1:03 PM  Endocrinology follow-up note  Stephanie [name] is a 71 y.o.-year-old female, referred by her  [name], MD . She was seen here in consultation for hypercalcemia/hyperparathyroidism.  She is here to follow-up with repeat labs, and bone density.  Past Medical History:  Diagnosis Date   Anxiety    Arthritis    GERD (gastroesophageal reflux disease)    HTN (hypertension)    Hyperlipidemia    Hyperparathyroidism ([place])    Pneumonia    Vitamin D deficiency     Past Surgical History:  Procedure Laterality Date   APPENDECTOMY     COLONOSCOPY  09/03/2006   EUM:PNTI diverticulosis in the sigmoid colon.  Otherwise normal colon   COLONOSCOPY N/A 05/23/2016   Procedure: COLONOSCOPY;  Surgeon: Danie Binder, MD;  Location: [facility];  Service: Endoscopy;  Laterality: N/A;  1000   PARATHYROIDECTOMY Left 01/27/2022   Procedure: LEFT INFERIOR PARATHYROIDECTOMY;  Surgeon: Armandina Gemma, MD;  Location: [facility];  Service: General;  Laterality: Left;    Social History   Tobacco Use   Smoking status: Every Day    Packs/day: 1.00    Years: 20.00    Total pack years: 20.00    Types: Cigarettes   Smokeless tobacco: Never   Tobacco comments:    Smokes 1/2 pack a day [facility] 01/06/22  Vaping Use   Vaping Use: Never used  Substance Use Topics   Alcohol use: Yes    Alcohol/week: 12.0 standard drinks of alcohol    Types: 6 Cans of beer, 6 Standard drinks or equivalent per week    Comment: 6 pack in a week   Drug use: Yes    Frequency: 1.0 times per week    Types: Marijuana    Family History  Problem Relation Age of Onset   Hypertension Mother    Hyperlipidemia Mother    Hypertension Father    Colon cancer Neg Hx    Colon polyps Neg Hx    Liver disease Neg Hx     Outpatient Encounter Medications as of 10/20/2022  Medication Sig   allopurinol (ZYLOPRIM) 100 MG tablet Take 100 mg by mouth daily.   amLODipine (NORVASC) 10 MG tablet Take 10 mg by  mouth daily.   aspirin EC 81 MG tablet Take 81 mg by mouth daily.   atorvastatin (LIPITOR) 40 MG tablet Take 40 mg by mouth daily.   Cholecalciferol (VITAMIN D3 PO) Take 1,000 Units by mouth daily in the afternoon.   cloNIDine (CATAPRES) 0.1 MG tablet Take 1 tablet (0.1 mg total) by mouth 2 (two) times daily.   folic acid (FOLVITE) 1 MG tablet Take 1 mg by mouth daily.   losartan (COZAAR) 100 MG tablet Take 100 mg by mouth daily.   omeprazole (PRILOSEC) 20 MG capsule Take 20 mg by mouth daily.   No facility-administered encounter medications on file as of 10/20/2022.    No Known Allergies   HPI  Stephanie [name] was diagnosed with hypercalcemia in 2021.  She did have several instances of elevated calcium in the last year.  After appropriate work-up indicated primary hyperparathyroidism, she was sent for parathyroidectomy which she underwent on January 27, 2022.  Surgical pathology showed hypercellular parathyroid consistent with an adenoma in the left inferior gland.     Her subsequent labs show correction of calcium to 9.8, however PTH remains high at 70.   Her previsit labs show stable calcium at 9.6, PTH  at 79.  She is recovering from surgery related  Her bone density from September 25, 2021 shows osteopenia on the hips and forearms.  No prior history of fragility fractures or falls. No history of  kidney stones.   She has normal renal function  she is not on HCTZ or other thiazide therapy.  She has vitamin D deficiency, currently on vitamin D3 1000 units daily.,  After her recent labs showed repeat vitamin D at 28. she is not on calcium supplements,  she eats dairy and green, leafy, vegetables on average amounts.  she does not have a family history of hypercalcemia, pituitary tumors, thyroid cancer, or osteoporosis.  She denies any height loss.  Her medical history includes alcohol abuse, her magnesium status not known.    ROS:  Constitutional: + Minimally fluctuating body  weight,  no fatigue, no subjective hyperthermia, no subjective hypothermia   PE: BP 126/66   Pulse 88   Ht '5\' 2"'$  (1.575 m)   Wt 120 lb 12.8 oz (54.8 kg)   BMI 22.09 kg/m , Body mass index is 22.09 kg/m. Wt Readings from Last 3 Encounters:  10/20/22 120 lb 12.8 oz (54.8 kg)  07/10/22 119 lb (54 kg)  04/17/22 118 lb (53.5 kg)    Constitutional: + BMI of 26.6, not in acute distress, normal state of mind Eyes: PERRLA, EOMI, no exophthalmos ENT: moist mucous membranes, no gross thyromegaly, no gross cervical lymphadenopathy   Recent Results (from the past 2160 hour(s))  PTH, intact and calcium     Status: None   Collection Time: 10/14/22 10:20 AM  Result Value Ref Range   Calcium 9.6 8.7 - 10.3 mg/dL   PTH 43 15 - 65 pg/mL   PTH Interp Comment     Comment: Interpretation                 Intact PTH    Calcium                                 (pg/mL)      (mg/dL) Normal                          15 - 65     8.6 - 10.2 Primary Hyperparathyroidism         >65          >10.2 Secondary Hyperparathyroidism       >65          <10.2 Non-Parathyroid Hypercalcemia       <65          >10.2 Hypoparathyroidism                  <15          < 8.6 Non-Parathyroid Hypocalcemia    15 - 65          < 8.6    Her referral package includes the following studies Ultrasound of the kidneys in July 2022 were normal.  24-hour urine collection showed on 3 mg / 24 hours of calcium.   May 07, 2021 parathyroid scan showed positive exam for presence of left inferior parathyroid adenoma.  Recent Results (from the past 2160 hour(s))  PTH, intact and calcium     Status: None   Collection Time: 10/14/22 10:20 AM  Result Value Ref Range   Calcium 9.6 8.7 - 10.3 mg/dL  PTH 43 15 - 65 pg/mL   PTH Interp Comment     Comment: Interpretation                 Intact PTH    Calcium                                 (pg/mL)      (mg/dL) Normal                          15 - 65     8.6 - 10.2 Primary  Hyperparathyroidism         >65          >10.2 Secondary Hyperparathyroidism       >65          <10.2 Non-Parathyroid Hypercalcemia       <65          >10.2 Hypoparathyroidism                  <15          < 8.6 Non-Parathyroid Hypocalcemia    15 - 65          < 8.6     Bone density September 25, 2021 DualFemur Total Right 09/25/2021 68.6 Osteopenia -2.3 0.713 g/cm2 -  DualFemur Total Mean 09/25/2021 68.6 Osteopenia -2.2 0.728 g/cm2 - -   Left Forearm Radius 33% 09/25/2021 68.6 Osteopenia -1.7 0.593 g/cm2 - - ASSESSMENT: The BMD measured at Femur Total Right is 0.713 g/cm2 with a T-score of -2.3. This patient is considered osteopenic according to [facility]) criteria. The scan quality is good. Lumbar spine was excluded due to advanced degenerative changes.   Assessment: 1. Hypercalcemia / Hyperparathyroidism-status post left inferior parathyroidectomy 2.  Osteopenia-hips/forearm 3.  Vitamin D deficiency  Plan: Patient has had several instances of elevated calcium, with the highest level being 11 mg per DL with high PTH of 84.  After appropriate work-up, she was sent for surgery which she underwent on January 27, 2022 which resulted in left inferior parathyroid gland adenoma.   -She has had a parathyroid sestamibi scan in May 2022 showing left inferior possible parathyroid adenoma.   -Her bone density shows osteopenia of hips and forearms.  She presents with stable labs including 9.64 calcium 843 for PTH.     She will be continued on observation status.  Her vitamin D was lowered to 1000 units daily in face of repeat 25-hydroxy vitamin D at 76.   She is also counseled on slowly weaning off of alcohol. The patient was counseled on the dangers of tobacco use, and was advised to quit.  Reviewed strategies to maximize success, including removing cigarettes and smoking materials from environment.   She is advised to continue follow-up with her PMD.   I spent   21 minutes in the care of the patient today including review of labs from Thyroid Function, CMP, and other relevant labs ; imaging/biopsy records (current and previous including abstractions from other facilities); face-to-face time discussing  her lab results and symptoms, medications doses, her options of short and long term treatment based on the latest standards of care / guidelines;   and documenting the encounter.  Deon Pilling  participated in the discussions, expressed understanding, and voiced agreement with the above plans.  All questions were answered to her satisfaction. she is  encouraged to contact clinic should she have any questions or concerns prior to her return visit.    - Return if symptoms worsen or fail to improve.   Glade Lloyd, MD [facility] [address] [place], [place] [address] Phone: [phone]  Fax: [phone]    This note was partially dictated with voice recognition software. Similar sounding words can be transcribed inadequately or may not  be corrected upon review.  10/20/2022, 1:03 PM

## 2022-11-03 ENCOUNTER — Other Ambulatory Visit (HOSPITAL_COMMUNITY): Payer: Self-pay | Admitting: Internal Medicine

## 2022-11-03 DIAGNOSIS — Z1231 Encounter for screening mammogram for malignant neoplasm of breast: Secondary | ICD-10-CM

## 2022-11-06 ENCOUNTER — Ambulatory Visit (HOSPITAL_COMMUNITY)
Admission: RE | Admit: 2022-11-06 | Discharge: 2022-11-06 | Disposition: A | Discharge status: Home / Self Care | Payer: Medicare HMO | Source: Ambulatory Visit | Attending: Internal Medicine | Admitting: Internal Medicine

## 2022-11-06 DIAGNOSIS — Z1231 Encounter for screening mammogram for malignant neoplasm of breast: Secondary | ICD-10-CM | POA: Insufficient documentation

## 2022-11-07 DIAGNOSIS — I10 Essential (primary) hypertension: Secondary | ICD-10-CM | POA: Diagnosis not present

## 2022-11-07 DIAGNOSIS — K219 Gastro-esophageal reflux disease without esophagitis: Secondary | ICD-10-CM | POA: Diagnosis not present

## 2022-11-07 DIAGNOSIS — Z0001 Encounter for general adult medical examination with abnormal findings: Secondary | ICD-10-CM | POA: Diagnosis not present

## 2022-11-07 DIAGNOSIS — E785 Hyperlipidemia, unspecified: Secondary | ICD-10-CM | POA: Diagnosis not present

## 2022-11-11 DIAGNOSIS — R69 Illness, unspecified: Secondary | ICD-10-CM | POA: Diagnosis not present

## 2022-11-11 DIAGNOSIS — E785 Hyperlipidemia, unspecified: Secondary | ICD-10-CM | POA: Diagnosis not present

## 2022-11-11 DIAGNOSIS — Z23 Encounter for immunization: Secondary | ICD-10-CM | POA: Diagnosis not present

## 2022-11-11 DIAGNOSIS — K219 Gastro-esophageal reflux disease without esophagitis: Secondary | ICD-10-CM | POA: Diagnosis not present

## 2022-11-11 DIAGNOSIS — I10 Essential (primary) hypertension: Secondary | ICD-10-CM | POA: Diagnosis not present

## 2022-12-12 DIAGNOSIS — E782 Mixed hyperlipidemia: Secondary | ICD-10-CM | POA: Diagnosis not present

## 2022-12-12 DIAGNOSIS — I10 Essential (primary) hypertension: Secondary | ICD-10-CM | POA: Diagnosis not present

## 2023-01-12 DIAGNOSIS — E785 Hyperlipidemia, unspecified: Secondary | ICD-10-CM | POA: Diagnosis not present

## 2023-01-12 DIAGNOSIS — I10 Essential (primary) hypertension: Secondary | ICD-10-CM | POA: Diagnosis not present

## 2023-01-22 DIAGNOSIS — E210 Primary hyperparathyroidism: Secondary | ICD-10-CM | POA: Diagnosis not present

## 2023-01-22 DIAGNOSIS — N182 Chronic kidney disease, stage 2 (mild): Secondary | ICD-10-CM | POA: Diagnosis not present

## 2023-01-22 DIAGNOSIS — I129 Hypertensive chronic kidney disease with stage 1 through stage 4 chronic kidney disease, or unspecified chronic kidney disease: Secondary | ICD-10-CM | POA: Diagnosis not present

## 2023-01-22 DIAGNOSIS — R809 Proteinuria, unspecified: Secondary | ICD-10-CM | POA: Diagnosis not present

## 2023-01-27 DIAGNOSIS — R809 Proteinuria, unspecified: Secondary | ICD-10-CM | POA: Diagnosis not present

## 2023-01-27 DIAGNOSIS — E210 Primary hyperparathyroidism: Secondary | ICD-10-CM | POA: Diagnosis not present

## 2023-01-27 DIAGNOSIS — I129 Hypertensive chronic kidney disease with stage 1 through stage 4 chronic kidney disease, or unspecified chronic kidney disease: Secondary | ICD-10-CM | POA: Diagnosis not present

## 2023-01-27 DIAGNOSIS — N182 Chronic kidney disease, stage 2 (mild): Secondary | ICD-10-CM | POA: Diagnosis not present

## 2023-01-27 DIAGNOSIS — E538 Deficiency of other specified B group vitamins: Secondary | ICD-10-CM | POA: Diagnosis not present

## 2023-02-10 DIAGNOSIS — I10 Essential (primary) hypertension: Secondary | ICD-10-CM | POA: Diagnosis not present

## 2023-02-10 DIAGNOSIS — E782 Mixed hyperlipidemia: Secondary | ICD-10-CM | POA: Diagnosis not present

## 2023-03-13 DIAGNOSIS — I10 Essential (primary) hypertension: Secondary | ICD-10-CM | POA: Diagnosis not present

## 2023-03-13 DIAGNOSIS — E785 Hyperlipidemia, unspecified: Secondary | ICD-10-CM | POA: Diagnosis not present

## 2023-04-12 DIAGNOSIS — I10 Essential (primary) hypertension: Secondary | ICD-10-CM | POA: Diagnosis not present

## 2023-04-12 DIAGNOSIS — E785 Hyperlipidemia, unspecified: Secondary | ICD-10-CM | POA: Diagnosis not present

## 2023-05-11 DIAGNOSIS — M109 Gout, unspecified: Secondary | ICD-10-CM | POA: Diagnosis not present

## 2023-05-11 DIAGNOSIS — K219 Gastro-esophageal reflux disease without esophagitis: Secondary | ICD-10-CM | POA: Diagnosis not present

## 2023-05-11 DIAGNOSIS — N184 Chronic kidney disease, stage 4 (severe): Secondary | ICD-10-CM | POA: Diagnosis not present

## 2023-05-11 DIAGNOSIS — R7301 Impaired fasting glucose: Secondary | ICD-10-CM | POA: Diagnosis not present

## 2023-05-11 DIAGNOSIS — I700 Atherosclerosis of aorta: Secondary | ICD-10-CM | POA: Diagnosis not present

## 2023-05-11 DIAGNOSIS — I10 Essential (primary) hypertension: Secondary | ICD-10-CM | POA: Diagnosis not present

## 2023-05-11 DIAGNOSIS — Z23 Encounter for immunization: Secondary | ICD-10-CM | POA: Diagnosis not present

## 2023-05-11 DIAGNOSIS — E785 Hyperlipidemia, unspecified: Secondary | ICD-10-CM | POA: Diagnosis not present

## 2023-05-11 DIAGNOSIS — R918 Other nonspecific abnormal finding of lung field: Secondary | ICD-10-CM | POA: Diagnosis not present

## 2023-05-11 DIAGNOSIS — F17200 Nicotine dependence, unspecified, uncomplicated: Secondary | ICD-10-CM | POA: Diagnosis not present

## 2023-05-19 DIAGNOSIS — I700 Atherosclerosis of aorta: Secondary | ICD-10-CM | POA: Diagnosis not present

## 2023-05-19 DIAGNOSIS — F17200 Nicotine dependence, unspecified, uncomplicated: Secondary | ICD-10-CM | POA: Diagnosis not present

## 2023-05-19 DIAGNOSIS — J449 Chronic obstructive pulmonary disease, unspecified: Secondary | ICD-10-CM | POA: Diagnosis not present

## 2023-05-19 DIAGNOSIS — F17210 Nicotine dependence, cigarettes, uncomplicated: Secondary | ICD-10-CM | POA: Diagnosis not present

## 2023-05-19 DIAGNOSIS — Z0001 Encounter for general adult medical examination with abnormal findings: Secondary | ICD-10-CM | POA: Diagnosis not present

## 2023-05-19 DIAGNOSIS — Z1331 Encounter for screening for depression: Secondary | ICD-10-CM | POA: Diagnosis not present

## 2023-05-19 DIAGNOSIS — Z7189 Other specified counseling: Secondary | ICD-10-CM | POA: Diagnosis not present

## 2023-05-19 DIAGNOSIS — Z1389 Encounter for screening for other disorder: Secondary | ICD-10-CM | POA: Diagnosis not present

## 2023-05-19 DIAGNOSIS — I10 Essential (primary) hypertension: Secondary | ICD-10-CM | POA: Diagnosis not present

## 2023-05-19 DIAGNOSIS — E785 Hyperlipidemia, unspecified: Secondary | ICD-10-CM | POA: Diagnosis not present

## 2023-05-20 ENCOUNTER — Other Ambulatory Visit (HOSPITAL_COMMUNITY): Payer: Self-pay | Admitting: Internal Medicine

## 2023-05-20 DIAGNOSIS — M810 Age-related osteoporosis without current pathological fracture: Secondary | ICD-10-CM

## 2023-06-18 DIAGNOSIS — I10 Essential (primary) hypertension: Secondary | ICD-10-CM | POA: Diagnosis not present

## 2023-06-18 DIAGNOSIS — K219 Gastro-esophageal reflux disease without esophagitis: Secondary | ICD-10-CM | POA: Diagnosis not present

## 2023-07-19 DIAGNOSIS — I10 Essential (primary) hypertension: Secondary | ICD-10-CM | POA: Diagnosis not present

## 2023-07-19 DIAGNOSIS — K219 Gastro-esophageal reflux disease without esophagitis: Secondary | ICD-10-CM | POA: Diagnosis not present

## 2023-07-30 DIAGNOSIS — N189 Chronic kidney disease, unspecified: Secondary | ICD-10-CM | POA: Diagnosis not present

## 2023-07-30 DIAGNOSIS — R809 Proteinuria, unspecified: Secondary | ICD-10-CM | POA: Diagnosis not present

## 2023-07-30 DIAGNOSIS — D631 Anemia in chronic kidney disease: Secondary | ICD-10-CM | POA: Diagnosis not present

## 2023-08-07 DIAGNOSIS — E538 Deficiency of other specified B group vitamins: Secondary | ICD-10-CM | POA: Diagnosis not present

## 2023-08-07 DIAGNOSIS — I129 Hypertensive chronic kidney disease with stage 1 through stage 4 chronic kidney disease, or unspecified chronic kidney disease: Secondary | ICD-10-CM | POA: Diagnosis not present

## 2023-08-07 DIAGNOSIS — N182 Chronic kidney disease, stage 2 (mild): Secondary | ICD-10-CM | POA: Diagnosis not present

## 2023-08-07 DIAGNOSIS — R809 Proteinuria, unspecified: Secondary | ICD-10-CM | POA: Diagnosis not present

## 2023-08-19 DIAGNOSIS — K219 Gastro-esophageal reflux disease without esophagitis: Secondary | ICD-10-CM | POA: Diagnosis not present

## 2023-08-19 DIAGNOSIS — I10 Essential (primary) hypertension: Secondary | ICD-10-CM | POA: Diagnosis not present

## 2023-08-21 DIAGNOSIS — N182 Chronic kidney disease, stage 2 (mild): Secondary | ICD-10-CM | POA: Diagnosis not present

## 2023-08-21 DIAGNOSIS — N189 Chronic kidney disease, unspecified: Secondary | ICD-10-CM | POA: Diagnosis not present

## 2023-08-21 DIAGNOSIS — E875 Hyperkalemia: Secondary | ICD-10-CM | POA: Diagnosis not present

## 2023-08-21 DIAGNOSIS — I129 Hypertensive chronic kidney disease with stage 1 through stage 4 chronic kidney disease, or unspecified chronic kidney disease: Secondary | ICD-10-CM | POA: Diagnosis not present

## 2023-08-21 DIAGNOSIS — E210 Primary hyperparathyroidism: Secondary | ICD-10-CM | POA: Diagnosis not present

## 2023-09-18 DIAGNOSIS — I10 Essential (primary) hypertension: Secondary | ICD-10-CM | POA: Diagnosis not present

## 2023-09-18 DIAGNOSIS — K219 Gastro-esophageal reflux disease without esophagitis: Secondary | ICD-10-CM | POA: Diagnosis not present

## 2023-09-30 ENCOUNTER — Other Ambulatory Visit (HOSPITAL_COMMUNITY): Payer: Self-pay | Admitting: Internal Medicine

## 2023-09-30 ENCOUNTER — Ambulatory Visit (HOSPITAL_COMMUNITY)
Admission: RE | Admit: 2023-09-30 | Discharge: 2023-09-30 | Disposition: A | Discharge status: Home / Self Care | Payer: Medicare HMO | Source: Ambulatory Visit | Attending: Internal Medicine | Admitting: Internal Medicine

## 2023-09-30 DIAGNOSIS — Z1231 Encounter for screening mammogram for malignant neoplasm of breast: Secondary | ICD-10-CM

## 2023-09-30 DIAGNOSIS — M810 Age-related osteoporosis without current pathological fracture: Secondary | ICD-10-CM | POA: Insufficient documentation

## 2023-10-19 DIAGNOSIS — I10 Essential (primary) hypertension: Secondary | ICD-10-CM | POA: Diagnosis not present

## 2023-11-09 ENCOUNTER — Encounter (HOSPITAL_COMMUNITY): Payer: Self-pay

## 2023-11-09 ENCOUNTER — Ambulatory Visit (HOSPITAL_COMMUNITY)
Admission: RE | Admit: 2023-11-09 | Discharge: 2023-11-09 | Disposition: A | Discharge status: Home / Self Care | Payer: Medicare HMO | Source: Ambulatory Visit | Attending: Internal Medicine | Admitting: Internal Medicine

## 2023-11-09 DIAGNOSIS — Z1231 Encounter for screening mammogram for malignant neoplasm of breast: Secondary | ICD-10-CM | POA: Diagnosis not present

## 2023-11-10 DIAGNOSIS — H40013 Open angle with borderline findings, low risk, bilateral: Secondary | ICD-10-CM | POA: Diagnosis not present

## 2023-11-10 DIAGNOSIS — H2513 Age-related nuclear cataract, bilateral: Secondary | ICD-10-CM | POA: Diagnosis not present

## 2023-12-08 DIAGNOSIS — F331 Major depressive disorder, recurrent, moderate: Secondary | ICD-10-CM | POA: Diagnosis not present

## 2023-12-08 DIAGNOSIS — K219 Gastro-esophageal reflux disease without esophagitis: Secondary | ICD-10-CM | POA: Diagnosis not present

## 2023-12-08 DIAGNOSIS — I10 Essential (primary) hypertension: Secondary | ICD-10-CM | POA: Diagnosis not present

## 2023-12-08 DIAGNOSIS — F17210 Nicotine dependence, cigarettes, uncomplicated: Secondary | ICD-10-CM | POA: Diagnosis not present

## 2023-12-08 DIAGNOSIS — J449 Chronic obstructive pulmonary disease, unspecified: Secondary | ICD-10-CM | POA: Diagnosis not present

## 2023-12-08 DIAGNOSIS — Z23 Encounter for immunization: Secondary | ICD-10-CM | POA: Diagnosis not present

## 2024-01-08 DIAGNOSIS — I10 Essential (primary) hypertension: Secondary | ICD-10-CM | POA: Diagnosis not present

## 2024-01-08 DIAGNOSIS — E785 Hyperlipidemia, unspecified: Secondary | ICD-10-CM | POA: Diagnosis not present

## 2024-02-05 DIAGNOSIS — I10 Essential (primary) hypertension: Secondary | ICD-10-CM | POA: Diagnosis not present

## 2024-02-05 DIAGNOSIS — K219 Gastro-esophageal reflux disease without esophagitis: Secondary | ICD-10-CM | POA: Diagnosis not present

## 2024-02-17 DIAGNOSIS — N182 Chronic kidney disease, stage 2 (mild): Secondary | ICD-10-CM | POA: Diagnosis not present

## 2024-02-17 DIAGNOSIS — I129 Hypertensive chronic kidney disease with stage 1 through stage 4 chronic kidney disease, or unspecified chronic kidney disease: Secondary | ICD-10-CM | POA: Diagnosis not present

## 2024-02-17 DIAGNOSIS — N189 Chronic kidney disease, unspecified: Secondary | ICD-10-CM | POA: Diagnosis not present

## 2024-02-17 DIAGNOSIS — E875 Hyperkalemia: Secondary | ICD-10-CM | POA: Diagnosis not present

## 2024-02-17 DIAGNOSIS — R809 Proteinuria, unspecified: Secondary | ICD-10-CM | POA: Diagnosis not present

## 2024-02-17 DIAGNOSIS — E210 Primary hyperparathyroidism: Secondary | ICD-10-CM | POA: Diagnosis not present

## 2024-02-23 DIAGNOSIS — H5203 Hypermetropia, bilateral: Secondary | ICD-10-CM | POA: Diagnosis not present

## 2024-02-23 DIAGNOSIS — H2513 Age-related nuclear cataract, bilateral: Secondary | ICD-10-CM | POA: Diagnosis not present

## 2024-02-25 DIAGNOSIS — R809 Proteinuria, unspecified: Secondary | ICD-10-CM | POA: Diagnosis not present

## 2024-02-25 DIAGNOSIS — E210 Primary hyperparathyroidism: Secondary | ICD-10-CM | POA: Diagnosis not present

## 2024-02-25 DIAGNOSIS — N182 Chronic kidney disease, stage 2 (mild): Secondary | ICD-10-CM | POA: Diagnosis not present

## 2024-02-25 DIAGNOSIS — I129 Hypertensive chronic kidney disease with stage 1 through stage 4 chronic kidney disease, or unspecified chronic kidney disease: Secondary | ICD-10-CM | POA: Diagnosis not present

## 2024-03-04 DIAGNOSIS — I10 Essential (primary) hypertension: Secondary | ICD-10-CM | POA: Diagnosis not present

## 2024-03-04 DIAGNOSIS — K219 Gastro-esophageal reflux disease without esophagitis: Secondary | ICD-10-CM | POA: Diagnosis not present

## 2024-03-15 DIAGNOSIS — N189 Chronic kidney disease, unspecified: Secondary | ICD-10-CM | POA: Diagnosis not present

## 2024-03-15 DIAGNOSIS — E871 Hypo-osmolality and hyponatremia: Secondary | ICD-10-CM | POA: Diagnosis not present

## 2024-03-22 DIAGNOSIS — H5203 Hypermetropia, bilateral: Secondary | ICD-10-CM | POA: Diagnosis not present

## 2024-03-22 DIAGNOSIS — H2512 Age-related nuclear cataract, left eye: Secondary | ICD-10-CM | POA: Diagnosis not present

## 2024-03-22 DIAGNOSIS — H2513 Age-related nuclear cataract, bilateral: Secondary | ICD-10-CM | POA: Diagnosis not present

## 2024-03-24 DIAGNOSIS — I129 Hypertensive chronic kidney disease with stage 1 through stage 4 chronic kidney disease, or unspecified chronic kidney disease: Secondary | ICD-10-CM | POA: Diagnosis not present

## 2024-03-24 DIAGNOSIS — N182 Chronic kidney disease, stage 2 (mild): Secondary | ICD-10-CM | POA: Diagnosis not present

## 2024-03-24 DIAGNOSIS — E875 Hyperkalemia: Secondary | ICD-10-CM | POA: Diagnosis not present

## 2024-03-24 DIAGNOSIS — E871 Hypo-osmolality and hyponatremia: Secondary | ICD-10-CM | POA: Diagnosis not present

## 2024-03-29 NOTE — H&P (Signed)
 Surgical History & Physical  Patient Name: Stephanie [name]  DOB: [birth year]  Surgery: Cataract extraction with intraocular lens implant phacoemulsification; Left Eye Surgeon: Ardeth Krabbe MD Surgery Date: 04/01/2024 Pre-Op Date: 03/22/2024  HPI: A 56 Yr. old female patient 1. The patient is here for a dilated exam, before cataract surgery. Pt. complains of difficulty when reading and distance vision. Both eyes are affected. The condition's severity increased since last visit. Symptoms occur when the patient is driving and reading. The complaint is associated with blurry vision. This is negatively affecting the patient\r\n\'s quality of life and the patient is unable to function adequately in life with the current level of vision. Pt. is ready to proceed with surgery. HPI was performed by Ardeth Krabbe .  Medical History: Glaucoma Cataracts  GOUT High Blood Pressure LDL Thyroid  Problems  Cardiovascular High Blood Pressure Musculoskeletal GOUT All recorded systems are negative except as noted above.  Social Current every day smoker   Medication Prednisolone acetate, Ciprofloxacin HCl,  Carvedilol , Amlodipine, Allopurinol, Losartan, Atorvastatin, Folic acid   Sx/Procedures Thyroid  surgery  Drug Allergies  NKDA  History & Physical: Heent: cataract NECK: supple without bruits LUNGS: lungs clear to auscultation CV: regular rate and rhythm Abdomen: soft and non-tender  Impression & Plan: Assessment: 1.  CATARACT NUCLEAR SCLEROSIS AGE RELATED; Both Eyes (H25.13) 2.  HYPEROPIA; Both Eyes (H52.03)  Plan: 1.  Cataracts are visually significant and account for the patient's complaints. Discussed all risks, benefits, procedures and recovery, including infection, loss of vision and eye, need for glasses after surgery or additional procedures. Patient understands changing glasses will not improve vision. Patient indicated understanding of procedure. All questions answered.  Patient desires to have surgery, recommend phacoemulsification with intraocular lens. Patient to have preliminary testing necessary ([place]/IOL Master, Mac OCT, TOPO) Educational materials provided:Cataract.  Plan: - Proceed with cataract surgery OS followed by OD - Plan for best distance target with DIB00 - No DM, no fuchs, no prior eye surgery - good dilation - Dextenza   2.  Reassess after cataract extraction

## 2024-03-30 ENCOUNTER — Other Ambulatory Visit: Payer: Self-pay

## 2024-03-30 ENCOUNTER — Encounter (HOSPITAL_COMMUNITY): Payer: Self-pay

## 2024-03-30 ENCOUNTER — Encounter (HOSPITAL_COMMUNITY)
Admission: RE | Admit: 2024-03-30 | Discharge: 2024-03-30 | Disposition: A | Discharge status: Home / Self Care | Source: Ambulatory Visit | Attending: Optometry | Admitting: Optometry

## 2024-03-30 HISTORY — DX: Chronic obstructive pulmonary disease, unspecified: J44.9

## 2024-04-01 ENCOUNTER — Encounter (HOSPITAL_COMMUNITY): Payer: Self-pay | Admitting: Optometry

## 2024-04-01 ENCOUNTER — Ambulatory Visit (HOSPITAL_COMMUNITY): Admitting: Certified Registered"

## 2024-04-01 ENCOUNTER — Encounter (HOSPITAL_COMMUNITY)
Admission: RE | Disposition: A | Discharge status: Home / Self Care | Payer: Self-pay | Source: Home / Self Care | Attending: Optometry

## 2024-04-01 ENCOUNTER — Ambulatory Visit (HOSPITAL_COMMUNITY)
Admission: RE | Admit: 2024-04-01 | Discharge: 2024-04-01 | Disposition: A | Discharge status: Home / Self Care | Attending: Optometry | Admitting: Optometry

## 2024-04-01 DIAGNOSIS — J449 Chronic obstructive pulmonary disease, unspecified: Secondary | ICD-10-CM | POA: Insufficient documentation

## 2024-04-01 DIAGNOSIS — F17210 Nicotine dependence, cigarettes, uncomplicated: Secondary | ICD-10-CM | POA: Diagnosis not present

## 2024-04-01 DIAGNOSIS — H2512 Age-related nuclear cataract, left eye: Secondary | ICD-10-CM | POA: Diagnosis not present

## 2024-04-01 DIAGNOSIS — H5712 Ocular pain, left eye: Secondary | ICD-10-CM | POA: Diagnosis not present

## 2024-04-01 HISTORY — PX: CATARACT EXTRACTION W/PHACO: SHX586

## 2024-04-01 SURGERY — PHACOEMULSIFICATION, CATARACT, WITH IOL INSERTION
Anesthesia: Monitor Anesthesia Care | Site: Eye | Laterality: Left

## 2024-04-01 MED ORDER — STERILE WATER FOR IRRIGATION IR SOLN
Status: DC | PRN
  Administered 2024-04-01: 250 mL

## 2024-04-01 MED ORDER — PHENYLEPHRINE-KETOROLAC 1-0.3 % IO SOLN
INTRAOCULAR | Status: AC
  Filled 2024-04-01: qty 4

## 2024-04-01 MED ORDER — PHENYLEPHRINE HCL 2.5 % OP SOLN
1.0000 [drp] | OPHTHALMIC | Status: AC | PRN
  Administered 2024-04-01 (×3): 1 [drp] via OPHTHALMIC

## 2024-04-01 MED ORDER — SIGHTPATH DOSE#1 NA HYALUR & NA CHOND-NA HYALUR IO KIT
PACK | INTRAOCULAR | Status: DC | PRN
  Administered 2024-04-01: 1 via OPHTHALMIC

## 2024-04-01 MED ORDER — POVIDONE-IODINE 5 % OP SOLN
OPHTHALMIC | Status: DC | PRN
  Administered 2024-04-01: 1 via OPHTHALMIC

## 2024-04-01 MED ORDER — BSS IO SOLN
INTRAOCULAR | Status: DC | PRN
  Administered 2024-04-01: 15 mL via INTRAOCULAR

## 2024-04-01 MED ORDER — LIDOCAINE HCL 3.5 % OP GEL
1.0000 | Freq: Once | OPHTHALMIC | Status: AC
  Administered 2024-04-01: 1 via OPHTHALMIC

## 2024-04-01 MED ORDER — TETRACAINE HCL 0.5 % OP SOLN
1.0000 [drp] | OPHTHALMIC | Status: AC | PRN
  Administered 2024-04-01 (×3): 1 [drp] via OPHTHALMIC

## 2024-04-01 MED ORDER — LIDOCAINE HCL (PF) 1 % IJ SOLN
INTRAMUSCULAR | Status: DC | PRN
  Administered 2024-04-01: 2 mL

## 2024-04-01 MED ORDER — TETRACAINE 0.5 % OP SOLN OPTIME - NO CHARGE
OPHTHALMIC | Status: DC | PRN
  Administered 2024-04-01: 1 [drp] via OPHTHALMIC

## 2024-04-01 MED ORDER — BSS IO SOLN
INTRAOCULAR | Status: DC | PRN
  Administered 2024-04-01: 500 mL via OPHTHALMIC

## 2024-04-01 MED ORDER — MOXIFLOXACIN HCL 5 MG/ML IO SOLN
INTRAOCULAR | Status: DC | PRN
  Administered 2024-04-01: .2 mL via OPHTHALMIC

## 2024-04-01 MED ORDER — MOXIFLOXACIN HCL 5 MG/ML IO SOLN
INTRAOCULAR | Status: AC
  Filled 2024-04-01: qty 1

## 2024-04-01 MED ORDER — TROPICAMIDE 1 % OP SOLN
1.0000 [drp] | OPHTHALMIC | Status: AC | PRN
  Administered 2024-04-01 (×3): 1 [drp] via OPHTHALMIC

## 2024-04-01 SURGICAL SUPPLY — 14 items
CATARACT SUITE SIGHTPATH (MISCELLANEOUS) ×1 IMPLANT
CLOTH BEACON ORANGE TIMEOUT ST (SAFETY) ×1 IMPLANT
DRSG TEGADERM 4X4.75 (GAUZE/BANDAGES/DRESSINGS) ×1 IMPLANT
EYE SHIELD UNIVERSAL CLEAR (GAUZE/BANDAGES/DRESSINGS) IMPLANT
FEE CATARACT SUITE SIGHTPATH (MISCELLANEOUS) ×1 IMPLANT
GLOVE BIOGEL PI IND STRL 7.0 (GLOVE) ×2 IMPLANT
LENS IOL TECNIS EYHANCE 23.0 (Intraocular Lens) IMPLANT
NDL HYPO 18GX1.5 BLUNT FILL (NEEDLE) ×1 IMPLANT
NEEDLE HYPO 18GX1.5 BLUNT FILL (NEEDLE) ×1 IMPLANT
PAD ARMBOARD POSITIONER FOAM (MISCELLANEOUS) ×1 IMPLANT
POSITIONER HEAD 8X9X4 ADT (SOFTGOODS) ×1 IMPLANT
SYR TB 1ML LL NO SAFETY (SYRINGE) ×1 IMPLANT
TAPE SURG TRANSPORE 1 IN (GAUZE/BANDAGES/DRESSINGS) IMPLANT
WATER STERILE IRR 250ML POUR (IV SOLUTION) ×1 IMPLANT

## 2024-04-01 NOTE — Discharge Instructions (Signed)
 Please discharge patient when stable, will follow up today with Dr. Ilsa Iha at the [facility] office immediately following discharge.  Leave shield in place until visit.  All paperwork with discharge instructions will be given at the office.  [facility] Address:  [address]  [place], [place] [address]  Dr. Chaya Jan Phone: [phone]

## 2024-04-01 NOTE — Anesthesia Postprocedure Evaluation (Signed)
 Anesthesia Post Note  Patient: Stephanie [name]  Procedure(s) Performed: PHACOEMULSIFICATION, CATARACT, WITH IOL INSERTION (Left: Eye)  Patient location during evaluation: Phase II Anesthesia Type: MAC Level of consciousness: awake Pain management: pain level controlled Vital Signs Assessment: post-procedure vital signs reviewed and stable Respiratory status: spontaneous breathing and respiratory function stable Cardiovascular status: blood pressure returned to baseline and stable Postop Assessment: no headache and no apparent nausea or vomiting Anesthetic complications: no Comments: Late entry   No notable events documented.   Last Vitals:  Vitals:   04/01/24 0940 04/01/24 1033  BP: (!) 156/96 (!) 193/71  Pulse:  75  Resp:  15  Temp: 36.7 C 37 C  SpO2: 99% 99%    Last Pain:  Vitals:   04/01/24 1033  TempSrc: Oral  PainSc: 3                  Coretha Dew

## 2024-04-01 NOTE — Op Note (Signed)
 Date of procedure: 04/01/24  Pre-operative diagnosis: Visually significant age-related nuclear cataract, Left Eye (H25.12)  Post-operative diagnosis: Visually significant age-related nuclear cataract, Left Eye  Procedure: Removal of cataract via phacoemulsification and insertion of intra-ocular lens J&J DIB00 +23.0D into the capsular bag of the Left Eye  Attending surgeon: Gale Jude, MD  Anesthesia: MAC, Topical Akten  Complications: None  Estimated Blood Loss: <80mL (minimal)  Specimens: None  Implants:  Implant Name Type Inv. Item Serial No. Manufacturer Lot No. LRB No. Used Action  LENS IOL TECNIS EYHANCE 23.0 - [id number] Intraocular Lens LENS IOL TECNIS EYHANCE 23.0 [id number] [employer]  Left 1 Implanted    Indications:  Visually significant age-related cataract, Left Eye  Procedure:  The patient was seen and identified in the pre-operative area. The operative eye was identified and dilated.  The operative eye was marked.  Topical anesthesia was administered to the operative eye.     The patient was then to the operative suite and placed in the supine position.  A timeout was performed confirming the patient, procedure to be performed, and all other relevant information.   The patient's face was prepped and draped in the usual fashion for intra-ocular surgery.  A lid speculum was placed into the operative eye and the surgical microscope moved into place and focused.  An inferotemporal paracentesis was created using a 20 gauge paracentesis blade.  BSS mixed with Omidria , followed by 1% lidocaine  was injected into the anterior chamber.  Viscoelastic was injected into the anterior chamber.  A temporal clear-corneal main wound incision was created using a 2.39mm microkeratome.  A continuous curvilinear capsulorrhexis was initiated using an irrigating cystitome and completed using capsulorrhexis forceps.  Hydrodissection and hydrodeliniation were performed.  Viscoelastic was  injected into the anterior chamber.  A phacoemulsification handpiece and a chopper as a second instrument were used to remove the nucleus and epinucleus. The irrigation/aspiration handpiece was used to remove any remaining cortical material.   The capsular bag was reinflated with viscoelastic, checked, and found to be intact.  The intraocular lens was inserted into the capsular bag.  The irrigation/aspiration handpiece was used to remove any remaining viscoelastic.  The clear corneal wound and paracentesis wounds were then hydrated and checked with Weck-Cels to be watertight. Moxifloxacin  was instilled into the anterior chamber.  The lid-speculum and drape were removed. The patient's face was cleaned with a wet and dry 4x4.  A clear shield was taped over the eye. The patient was taken to the post-operative care unit in good condition, having tolerated the procedure well.  Post-Op Instructions: The patient will follow up at [facility] for a same day post-operative evaluation and will receive all other orders and instructions.

## 2024-04-01 NOTE — Interval H&P Note (Signed)
 History and Physical Interval Note:  04/01/2024 9:30 AM  The H and P was reviewed and updated. The patient was examined.  No changes were found after exam.  The surgical eye was marked.   Stephanie [name]

## 2024-04-01 NOTE — Anesthesia Preprocedure Evaluation (Signed)
 Anesthesia Evaluation  Patient identified by MRN, date of birth, ID band Patient awake    Reviewed: Allergy & Precautions, H&P , NPO status , Patient's Chart, lab work & pertinent test results, reviewed documented beta blocker date and time   Airway Mallampati: II  TM Distance: >3 FB Neck ROM: full    Dental no notable dental hx.    Pulmonary neg pulmonary ROS, pneumonia, COPD, Current Smoker   Pulmonary exam normal breath sounds clear to auscultation       Cardiovascular Exercise Tolerance: Good hypertension, negative cardio ROS  Rhythm:regular Rate:Normal     Neuro/Psych   Anxiety     negative neurological ROS  negative psych ROS   GI/Hepatic negative GI ROS, Neg liver ROS,GERD  ,,  Endo/Other  negative endocrine ROS    Renal/GU negative Renal ROS  negative genitourinary   Musculoskeletal   Abdominal   Peds  Hematology negative hematology ROS (+)   Anesthesia Other Findings   Reproductive/Obstetrics negative OB ROS                             Anesthesia Physical Anesthesia Plan  ASA: 3  Anesthesia Plan: MAC   Post-op Pain Management:    Induction:   PONV Risk Score and Plan:   Airway Management Planned:   Additional Equipment:   Intra-op Plan:   Post-operative Plan:   Informed Consent: I have reviewed the patients History and Physical, chart, labs and discussed the procedure including the risks, benefits and alternatives for the proposed anesthesia with the patient or authorized representative who has indicated his/her understanding and acceptance.     Dental Advisory Given  Plan Discussed with: CRNA  Anesthesia Plan Comments:        Anesthesia Quick Evaluation

## 2024-04-01 NOTE — Transfer of Care (Signed)
 Immediate Anesthesia Transfer of Care Note  Patient: Stephanie [name]  Procedure(s) Performed: PHACOEMULSIFICATION, CATARACT, WITH IOL INSERTION (Left: Eye)  Patient Location: PACU  Anesthesia Type:MAC  Level of Consciousness: awake, alert , oriented, and patient cooperative  Airway & Oxygen Therapy: Patient Spontanous Breathing  Post-op Assessment: Report given to RN, Post -op Vital signs reviewed and stable, and Patient moving all extremities X 4  Post vital signs: Reviewed and stable  Last Vitals:  Vitals Value Taken Time  BP 193/71   Temp WNL   Pulse 71   Resp 17   SpO2 100     Last Pain:  Vitals:   04/01/24 0935  PainSc: 0-No pain         Complications: No notable events documented.

## 2024-04-04 ENCOUNTER — Encounter (HOSPITAL_COMMUNITY): Payer: Self-pay | Admitting: Optometry

## 2024-04-04 DIAGNOSIS — K219 Gastro-esophageal reflux disease without esophagitis: Secondary | ICD-10-CM | POA: Diagnosis not present

## 2024-04-04 DIAGNOSIS — I10 Essential (primary) hypertension: Secondary | ICD-10-CM | POA: Diagnosis not present

## 2024-04-11 ENCOUNTER — Encounter (HOSPITAL_COMMUNITY): Payer: Self-pay

## 2024-04-11 ENCOUNTER — Encounter (HOSPITAL_COMMUNITY)
Admission: RE | Admit: 2024-04-11 | Discharge: 2024-04-11 | Disposition: A | Discharge status: Home / Self Care | Source: Ambulatory Visit | Attending: Optometry | Admitting: Optometry

## 2024-04-12 DIAGNOSIS — H2511 Age-related nuclear cataract, right eye: Secondary | ICD-10-CM | POA: Diagnosis not present

## 2024-04-12 NOTE — H&P (Signed)
 Surgical History & Physical  Patient Name: Stephanie [name]  DOB: [birth year]  Surgery: Cataract extraction with intraocular lens implant phacoemulsification; Right Eye Surgeon: Ardeth Krabbe MD Surgery Date: 04/15/2024 Pre-Op Date: 04/06/2024  HPI: A 47 Yr. old female patient 1. The patient is returning after cataract surgery. The left eye is affected. Status post cataract surgery, which began 5 days ago: Since the last visit, the affected area feels improvement. The patient's vision is improved. The condition's severity is constant. The complaint is associated with light sensitivity. Patient is following medication instructions. 2. The patient is returning for a cataract follow-up of the right eye. Since the last visit, the affected area is tolerating. The patient's vision is blurry. The condition's severity is constant.  Medical History: Glaucoma Cataracts  GOUT High Blood Pressure LDL Thyroid  Problems  Review of Systems Cardiovascular High Blood Pressure Musculoskeletal GOUT All recorded systems are negative except as noted above.  Social Current every day smoker   Medication Prednisolone acetate, Ciprofloxacin HCl,  Carvedilol , Amlodipine, Allopurinol, Losartan, Atorvastatin, Folic acid   Sx/Procedures Phaco c IOL OS,  Thyroid  surgery  Drug Allergies  NKDA  History & Physical: Heent: cataract NECK: supple without bruits LUNGS: lungs clear to auscultation CV: regular rate and rhythm Abdomen: soft and non-tender  Impression & Plan: Assessment: 1.  CATARACT EXTRACTION STATUS; Left Eye (Z98.42) 2.  INTRAOCULAR LENS IOL ; Left Eye (Z96.1) 3.  CATARACT NUCLEAR SCLEROSIS AGE RELATED; Both Eyes (H25.13)  Plan: 1.  POD5. Doing well. All post-op precautions discussed and instructions reviewed. Written instructions given.  2.   3.  Cataracts are visually significant and account for the patient's complaints. Discussed all risks, benefits, procedures and recovery,  including infection, loss of vision and eye, need for glasses after surgery or additional procedures. Patient understands changing glasses will not improve vision. Patient indicated understanding of procedure. All questions answered. Patient desires to have surgery, recommend phacoemulsification with intraocular lens. Patient to have preliminary testing necessary ([employer]/IOL Master, Mac OCT, TOPO) Educational materials provided:Cataract.  Plan: - Proceed with cataract surgery OD when ready - Plan for best distance target with DIB00 - No DM, no fuchs, no prior eye surgery - good dilation - Dextenza 

## 2024-04-15 ENCOUNTER — Ambulatory Visit (HOSPITAL_COMMUNITY): Admitting: Anesthesiology

## 2024-04-15 ENCOUNTER — Encounter (HOSPITAL_COMMUNITY)
Admission: RE | Disposition: A | Discharge status: Home / Self Care | Payer: Self-pay | Source: Home / Self Care | Attending: Optometry

## 2024-04-15 ENCOUNTER — Ambulatory Visit (HOSPITAL_COMMUNITY)
Admission: RE | Admit: 2024-04-15 | Discharge: 2024-04-15 | Disposition: A | Discharge status: Home / Self Care | Attending: Optometry | Admitting: Optometry

## 2024-04-15 DIAGNOSIS — F17210 Nicotine dependence, cigarettes, uncomplicated: Secondary | ICD-10-CM

## 2024-04-15 DIAGNOSIS — H2511 Age-related nuclear cataract, right eye: Secondary | ICD-10-CM | POA: Insufficient documentation

## 2024-04-15 DIAGNOSIS — I10 Essential (primary) hypertension: Secondary | ICD-10-CM

## 2024-04-15 DIAGNOSIS — K219 Gastro-esophageal reflux disease without esophagitis: Secondary | ICD-10-CM | POA: Insufficient documentation

## 2024-04-15 DIAGNOSIS — J449 Chronic obstructive pulmonary disease, unspecified: Secondary | ICD-10-CM

## 2024-04-15 DIAGNOSIS — F17200 Nicotine dependence, unspecified, uncomplicated: Secondary | ICD-10-CM | POA: Diagnosis not present

## 2024-04-15 HISTORY — PX: CATARACT EXTRACTION W/PHACO: SHX586

## 2024-04-15 SURGERY — PHACOEMULSIFICATION, CATARACT, WITH IOL INSERTION
Anesthesia: Monitor Anesthesia Care | Site: Eye | Laterality: Right

## 2024-04-15 MED ORDER — LIDOCAINE HCL 3.5 % OP GEL
1.0000 | Freq: Once | OPHTHALMIC | Status: AC
  Administered 2024-04-15: 1 via OPHTHALMIC

## 2024-04-15 MED ORDER — SODIUM CHLORIDE 0.9% FLUSH: 3.0000 mL | INTRAVENOUS | Status: DC | PRN

## 2024-04-15 MED ORDER — POVIDONE-IODINE 5 % OP SOLN
OPHTHALMIC | Status: DC | PRN
  Administered 2024-04-15: 1 via OPHTHALMIC

## 2024-04-15 MED ORDER — STERILE WATER FOR IRRIGATION IR SOLN
Status: DC | PRN
  Administered 2024-04-15: 250 mL

## 2024-04-15 MED ORDER — MOXIFLOXACIN HCL 5 MG/ML IO SOLN
INTRAOCULAR | Status: DC | PRN
  Administered 2024-04-15: .2 mL via INTRACAMERAL

## 2024-04-15 MED ORDER — SIGHTPATH DOSE#1 NA HYALUR & NA CHOND-NA HYALUR IO KIT
PACK | INTRAOCULAR | Status: DC | PRN
  Administered 2024-04-15: 1 via OPHTHALMIC

## 2024-04-15 MED ORDER — PHENYLEPHRINE-KETOROLAC 1-0.3 % IO SOLN
INTRAOCULAR | Status: DC | PRN
  Administered 2024-04-15: 500 mL via OPHTHALMIC

## 2024-04-15 MED ORDER — MIDAZOLAM HCL 5 MG/5ML IJ SOLN
INTRAMUSCULAR | Status: DC | PRN
  Administered 2024-04-15: 1 mg via INTRAVENOUS

## 2024-04-15 MED ORDER — TROPICAMIDE 1 % OP SOLN
1.0000 [drp] | OPHTHALMIC | Status: AC
  Administered 2024-04-15 (×3): 1 [drp] via OPHTHALMIC

## 2024-04-15 MED ORDER — MIDAZOLAM HCL 2 MG/2ML IJ SOLN
INTRAMUSCULAR | Status: AC
Start: 2024-04-15 — End: ?
  Filled 2024-04-15: qty 2

## 2024-04-15 MED ORDER — BSS IO SOLN
INTRAOCULAR | Status: DC | PRN
  Administered 2024-04-15: 15 mL via INTRAOCULAR

## 2024-04-15 MED ORDER — LIDOCAINE HCL (PF) 1 % IJ SOLN
INTRAMUSCULAR | Status: DC | PRN
  Administered 2024-04-15: 2 mL

## 2024-04-15 MED ORDER — PHENYLEPHRINE HCL 2.5 % OP SOLN
1.0000 [drp] | OPHTHALMIC | Status: AC
  Administered 2024-04-15 (×3): 1 [drp] via OPHTHALMIC

## 2024-04-15 MED ORDER — TETRACAINE HCL 0.5 % OP SOLN
1.0000 [drp] | OPHTHALMIC | Status: AC
  Administered 2024-04-15 (×3): 1 [drp] via OPHTHALMIC

## 2024-04-15 MED ORDER — SODIUM CHLORIDE 0.9% FLUSH
3.0000 mL | Freq: Two times a day (BID) | INTRAVENOUS | Status: DC
  Administered 2024-04-15: 10 mL via INTRAVENOUS

## 2024-04-15 SURGICAL SUPPLY — 14 items
CATARACT SUITE SIGHTPATH (MISCELLANEOUS) ×1 IMPLANT
CLOTH BEACON ORANGE TIMEOUT ST (SAFETY) ×1 IMPLANT
DRSG TEGADERM 4X4.75 (GAUZE/BANDAGES/DRESSINGS) ×1 IMPLANT
EYE SHIELD UNIVERSAL CLEAR (GAUZE/BANDAGES/DRESSINGS) IMPLANT
FEE CATARACT SUITE SIGHTPATH (MISCELLANEOUS) ×1 IMPLANT
GLOVE BIOGEL PI IND STRL 7.0 (GLOVE) ×2 IMPLANT
LENS IOL TECNIS EYHANCE 23.0 (Intraocular Lens) IMPLANT
NDL HYPO 18GX1.5 BLUNT FILL (NEEDLE) ×1 IMPLANT
NEEDLE HYPO 18GX1.5 BLUNT FILL (NEEDLE) ×1 IMPLANT
PAD ARMBOARD POSITIONER FOAM (MISCELLANEOUS) ×1 IMPLANT
POSITIONER HEAD 8X9X4 ADT (SOFTGOODS) ×1 IMPLANT
SYR TB 1ML LL NO SAFETY (SYRINGE) ×1 IMPLANT
TAPE SURG TRANSPORE 1 IN (GAUZE/BANDAGES/DRESSINGS) IMPLANT
WATER STERILE IRR 250ML POUR (IV SOLUTION) ×1 IMPLANT

## 2024-04-15 NOTE — Interval H&P Note (Signed)
 History and Physical Interval Note:  04/15/2024 7:14 AM  The H and P was reviewed and updated. The patient was examined.  No changes were found after exam.  The surgical eye was marked.   Stephanie [name]

## 2024-04-15 NOTE — Op Note (Signed)
 Date of procedure: 04/15/24  Pre-operative diagnosis: Visually significant age-related nuclear cataract, Right Eye (H25.11)  Post-operative diagnosis: Visually significant age-related nuclear cataract, Right Eye  Procedure: Removal of cataract via phacoemulsification and insertion of intra-ocular lens J&J DIBOO +23.0D into the capsular bag of the Right Eye  Attending surgeon: Ardeth Krabbe, MD  Anesthesia: MAC, Topical Akten   Complications: None  Estimated Blood Loss: <24mL (minimal)  Specimens: None  Implants:  Implant Name Type Inv. Item Serial No. Manufacturer Lot No. LRB No. Used Action  LENS IOL TECNIS EYHANCE 23.0 - [id number] Intraocular Lens LENS IOL TECNIS EYHANCE 23.0 [id number] [employer]  Right 1 Implanted    Indications:  Visually significant age-related cataract, Right Eye  Procedure:  The patient was seen and identified in the pre-operative area. The operative eye was identified and dilated.  The operative eye was marked.  Topical anesthesia was administered to the operative eye.     The patient was then to the operative suite and placed in the supine position.  A timeout was performed confirming the patient, procedure to be performed, and all other relevant information.   The patient's face was prepped and draped in the usual fashion for intra-ocular surgery.  A lid speculum was placed into the operative eye and the surgical microscope moved into place and focused.  A superotemporal paracentesis was created using a 20 gauge paracentesis blade.  BSS mixed with Omidria , followed by 1% lidocaine  was injected into the anterior chamber.  Viscoelastic was injected into the anterior chamber.  A temporal clear-corneal main wound incision was created using a 2.49mm microkeratome.  A continuous curvilinear capsulorrhexis was initiated using an irrigating cystitome and completed using capsulorrhexis forceps.  Hydrodissection and hydrodeliniation were performed.  Viscoelastic was  injected into the anterior chamber.  A phacoemulsification handpiece and a chopper as a second instrument were used to remove the nucleus and epinucleus. The irrigation/aspiration handpiece was used to remove any remaining cortical material.   The capsular bag was reinflated with viscoelastic, checked, and found to be intact.  The intraocular lens was inserted into the capsular bag.  The irrigation/aspiration handpiece was used to remove any remaining viscoelastic.  The clear corneal wound and paracentesis wounds were then hydrated and checked with Weck-Cels to be watertight. Moxifloxacin  was instilled into the anterior chamber.  The lid-speculum and drape were removed. The patient's face was cleaned with a wet and dry 4x4. A clear shield was taped over the eye. The patient was taken to the post-operative care unit in good condition, having tolerated the procedure well.  Post-Op Instructions: The patient will follow up at [facility] for a same day post-operative evaluation and will receive all other orders and instructions.

## 2024-04-15 NOTE — Transfer of Care (Signed)
 Immediate Anesthesia Transfer of Care Note  Patient: Stephanie [name]  Procedure(s) Performed: PHACOEMULSIFICATION, CATARACT, WITH IOL INSERTION (Right: Eye)  Patient Location: Short Stay  Anesthesia Type:MAC  Level of Consciousness: awake, alert , oriented, and patient cooperative  Airway & Oxygen Therapy: Patient Spontanous Breathing  Post-op Assessment: Report given to RN, Post -op Vital signs reviewed and stable, and Patient moving all extremities X 4  Post vital signs: Reviewed and stable  Last Vitals:  Vitals Value Taken Time  BP 151/58 04/15/24 0827  Temp 37.4 C 04/15/24 0827  Pulse 92 04/15/24 0827  Resp 20 04/15/24 0827  SpO2 97 % 04/15/24 0827    Last Pain:  Vitals:   04/15/24 0827  TempSrc: Oral  PainSc: 0-No pain      Patients Stated Pain Goal: 6 (04/15/24 0827)  Complications: No notable events documented.

## 2024-04-15 NOTE — Discharge Instructions (Signed)
 Please discharge patient when stable, will follow up today with Dr. Ilsa Iha at the [facility] office immediately following discharge.  Leave shield in place until visit.  All paperwork with discharge instructions will be given at the office.  [facility] Address:  [address]  [place], [place] [address]  Dr. Chaya Jan Phone: [phone]

## 2024-04-15 NOTE — Anesthesia Preprocedure Evaluation (Signed)
 Anesthesia Evaluation  Patient identified by MRN, date of birth, ID band Patient awake    Reviewed: Allergy & Precautions, H&P , NPO status , Patient's Chart, lab work & pertinent test results, reviewed documented beta blocker date and time   Airway Mallampati: II  TM Distance: >3 FB Neck ROM: full    Dental no notable dental hx.    Pulmonary neg pulmonary ROS, pneumonia, COPD, Current Smoker   Pulmonary exam normal breath sounds clear to auscultation       Cardiovascular Exercise Tolerance: Good hypertension, negative cardio ROS  Rhythm:regular Rate:Normal     Neuro/Psych   Anxiety     negative neurological ROS  negative psych ROS   GI/Hepatic negative GI ROS, Neg liver ROS,GERD  ,,  Endo/Other  negative endocrine ROS    Renal/GU negative Renal ROS  negative genitourinary   Musculoskeletal   Abdominal   Peds  Hematology negative hematology ROS (+)   Anesthesia Other Findings   Reproductive/Obstetrics negative OB ROS                             Anesthesia Physical Anesthesia Plan  ASA: 3  Anesthesia Plan: MAC   Post-op Pain Management:    Induction:   PONV Risk Score and Plan:   Airway Management Planned:   Additional Equipment:   Intra-op Plan:   Post-operative Plan:   Informed Consent: I have reviewed the patients History and Physical, chart, labs and discussed the procedure including the risks, benefits and alternatives for the proposed anesthesia with the patient or authorized representative who has indicated his/her understanding and acceptance.     Dental Advisory Given  Plan Discussed with: CRNA  Anesthesia Plan Comments:        Anesthesia Quick Evaluation

## 2024-04-18 ENCOUNTER — Encounter (HOSPITAL_COMMUNITY): Payer: Self-pay | Admitting: Optometry

## 2024-04-18 NOTE — Anesthesia Postprocedure Evaluation (Signed)
 Anesthesia Post Note  Patient: Stephanie [name]  Procedure(s) Performed: PHACOEMULSIFICATION, CATARACT, WITH IOL INSERTION (Right: Eye)  Patient location during evaluation: Phase II Anesthesia Type: MAC Level of consciousness: awake Pain management: pain level controlled Vital Signs Assessment: post-procedure vital signs reviewed and stable Respiratory status: spontaneous breathing and respiratory function stable Cardiovascular status: blood pressure returned to baseline and stable Postop Assessment: no headache and no apparent nausea or vomiting Anesthetic complications: no Comments: Late entry   No notable events documented.   Last Vitals:  Vitals:   04/15/24 0715 04/15/24 0827  BP: (!) 177/64 (!) 151/58  Pulse: 83 92  Resp: (!) 24 20  Temp:  37.4 C  SpO2: 97% 97%    Last Pain:  Vitals:   04/15/24 0827  TempSrc: Oral  PainSc: 0-No pain                 Coretha Dew

## 2024-05-04 DIAGNOSIS — I10 Essential (primary) hypertension: Secondary | ICD-10-CM | POA: Diagnosis not present

## 2024-05-04 DIAGNOSIS — E785 Hyperlipidemia, unspecified: Secondary | ICD-10-CM | POA: Diagnosis not present

## 2024-05-12 DIAGNOSIS — I2510 Atherosclerotic heart disease of native coronary artery without angina pectoris: Secondary | ICD-10-CM | POA: Diagnosis not present

## 2024-05-12 DIAGNOSIS — E854 Organ-limited amyloidosis: Secondary | ICD-10-CM | POA: Diagnosis not present

## 2024-05-12 DIAGNOSIS — I739 Peripheral vascular disease, unspecified: Secondary | ICD-10-CM | POA: Diagnosis not present

## 2024-05-12 DIAGNOSIS — I700 Atherosclerosis of aorta: Secondary | ICD-10-CM | POA: Diagnosis not present

## 2024-05-12 DIAGNOSIS — K219 Gastro-esophageal reflux disease without esophagitis: Secondary | ICD-10-CM | POA: Diagnosis not present

## 2024-05-12 DIAGNOSIS — I4891 Unspecified atrial fibrillation: Secondary | ICD-10-CM | POA: Diagnosis not present

## 2024-05-12 DIAGNOSIS — D6869 Other thrombophilia: Secondary | ICD-10-CM | POA: Diagnosis not present

## 2024-05-12 DIAGNOSIS — J439 Emphysema, unspecified: Secondary | ICD-10-CM | POA: Diagnosis not present

## 2024-05-12 DIAGNOSIS — I509 Heart failure, unspecified: Secondary | ICD-10-CM | POA: Diagnosis not present

## 2024-05-12 DIAGNOSIS — E785 Hyperlipidemia, unspecified: Secondary | ICD-10-CM | POA: Diagnosis not present

## 2024-05-12 DIAGNOSIS — Z8249 Family history of ischemic heart disease and other diseases of the circulatory system: Secondary | ICD-10-CM | POA: Diagnosis not present

## 2024-05-12 DIAGNOSIS — I130 Hypertensive heart and chronic kidney disease with heart failure and stage 1 through stage 4 chronic kidney disease, or unspecified chronic kidney disease: Secondary | ICD-10-CM | POA: Diagnosis not present

## 2024-06-04 DIAGNOSIS — E785 Hyperlipidemia, unspecified: Secondary | ICD-10-CM | POA: Diagnosis not present

## 2024-06-04 DIAGNOSIS — I10 Essential (primary) hypertension: Secondary | ICD-10-CM | POA: Diagnosis not present

## 2024-06-22 ENCOUNTER — Other Ambulatory Visit (HOSPITAL_COMMUNITY): Payer: Self-pay | Admitting: Gerontology

## 2024-06-22 DIAGNOSIS — I10 Essential (primary) hypertension: Secondary | ICD-10-CM | POA: Diagnosis not present

## 2024-06-22 DIAGNOSIS — Z87891 Personal history of nicotine dependence: Secondary | ICD-10-CM

## 2024-06-22 DIAGNOSIS — D631 Anemia in chronic kidney disease: Secondary | ICD-10-CM | POA: Diagnosis not present

## 2024-06-22 DIAGNOSIS — R918 Other nonspecific abnormal finding of lung field: Secondary | ICD-10-CM

## 2024-06-22 DIAGNOSIS — N189 Chronic kidney disease, unspecified: Secondary | ICD-10-CM | POA: Diagnosis not present

## 2024-06-22 DIAGNOSIS — M109 Gout, unspecified: Secondary | ICD-10-CM | POA: Diagnosis not present

## 2024-06-22 DIAGNOSIS — E785 Hyperlipidemia, unspecified: Secondary | ICD-10-CM | POA: Diagnosis not present

## 2024-06-22 DIAGNOSIS — R809 Proteinuria, unspecified: Secondary | ICD-10-CM | POA: Diagnosis not present

## 2024-06-28 DIAGNOSIS — I129 Hypertensive chronic kidney disease with stage 1 through stage 4 chronic kidney disease, or unspecified chronic kidney disease: Secondary | ICD-10-CM | POA: Diagnosis not present

## 2024-06-28 DIAGNOSIS — R809 Proteinuria, unspecified: Secondary | ICD-10-CM | POA: Diagnosis not present

## 2024-06-28 DIAGNOSIS — E210 Primary hyperparathyroidism: Secondary | ICD-10-CM | POA: Diagnosis not present

## 2024-06-28 DIAGNOSIS — N182 Chronic kidney disease, stage 2 (mild): Secondary | ICD-10-CM | POA: Diagnosis not present

## 2024-07-10 ENCOUNTER — Other Ambulatory Visit: Payer: Self-pay | Admitting: Medical Genetics

## 2024-07-23 DIAGNOSIS — E785 Hyperlipidemia, unspecified: Secondary | ICD-10-CM | POA: Diagnosis not present

## 2024-07-23 DIAGNOSIS — I10 Essential (primary) hypertension: Secondary | ICD-10-CM | POA: Diagnosis not present

## 2024-08-02 ENCOUNTER — Other Ambulatory Visit

## 2024-08-23 DIAGNOSIS — I10 Essential (primary) hypertension: Secondary | ICD-10-CM | POA: Diagnosis not present

## 2024-09-22 DIAGNOSIS — E785 Hyperlipidemia, unspecified: Secondary | ICD-10-CM | POA: Diagnosis not present

## 2024-09-22 DIAGNOSIS — I10 Essential (primary) hypertension: Secondary | ICD-10-CM | POA: Diagnosis not present

## 2024-09-29 ENCOUNTER — Other Ambulatory Visit: Payer: Self-pay | Admitting: Medical Genetics

## 2024-09-29 DIAGNOSIS — D7589 Other specified diseases of blood and blood-forming organs: Secondary | ICD-10-CM | POA: Diagnosis not present

## 2024-09-29 DIAGNOSIS — R809 Proteinuria, unspecified: Secondary | ICD-10-CM | POA: Diagnosis not present

## 2024-09-29 DIAGNOSIS — N189 Chronic kidney disease, unspecified: Secondary | ICD-10-CM | POA: Diagnosis not present

## 2024-09-29 DIAGNOSIS — Z006 Encounter for examination for normal comparison and control in clinical research program: Secondary | ICD-10-CM

## 2024-09-29 DIAGNOSIS — E559 Vitamin D deficiency, unspecified: Secondary | ICD-10-CM | POA: Diagnosis not present

## 2024-09-29 DIAGNOSIS — D631 Anemia in chronic kidney disease: Secondary | ICD-10-CM | POA: Diagnosis not present

## 2024-10-06 DIAGNOSIS — R809 Proteinuria, unspecified: Secondary | ICD-10-CM | POA: Diagnosis not present

## 2024-10-06 DIAGNOSIS — N1831 Chronic kidney disease, stage 3a: Secondary | ICD-10-CM | POA: Diagnosis not present

## 2024-10-06 DIAGNOSIS — I1A0 Resistant hypertension: Secondary | ICD-10-CM | POA: Diagnosis not present

## 2024-10-06 DIAGNOSIS — E210 Primary hyperparathyroidism: Secondary | ICD-10-CM | POA: Diagnosis not present

## 2024-10-23 DIAGNOSIS — I10 Essential (primary) hypertension: Secondary | ICD-10-CM | POA: Diagnosis not present

## 2024-10-23 DIAGNOSIS — E785 Hyperlipidemia, unspecified: Secondary | ICD-10-CM | POA: Diagnosis not present

## 2024-11-11 ENCOUNTER — Encounter: Payer: Self-pay | Admitting: *Deleted

## 2024-11-11 NOTE — Progress Notes (Signed)
 Stephanie [name]                                          MRN: [id number]   11/11/2024   The [facility] Quality Team Specialist reviewed this patient medical record for the purposes of chart review for care gap closure. The following were reviewed: chart review for care gap closure-controlling blood pressure.    [facility] Quality Team

## 2024-11-15 ENCOUNTER — Other Ambulatory Visit (HOSPITAL_COMMUNITY): Payer: Self-pay | Admitting: Internal Medicine

## 2024-11-15 DIAGNOSIS — Z1231 Encounter for screening mammogram for malignant neoplasm of breast: Secondary | ICD-10-CM

## 2024-11-22 DIAGNOSIS — I10 Essential (primary) hypertension: Secondary | ICD-10-CM | POA: Diagnosis not present

## 2024-11-22 DIAGNOSIS — E785 Hyperlipidemia, unspecified: Secondary | ICD-10-CM | POA: Diagnosis not present

## 2024-12-05 ENCOUNTER — Encounter (HOSPITAL_COMMUNITY): Payer: Self-pay

## 2024-12-05 ENCOUNTER — Ambulatory Visit (HOSPITAL_COMMUNITY)
Admission: RE | Admit: 2024-12-05 | Discharge: 2024-12-05 | Disposition: A | Discharge status: Home / Self Care | Source: Ambulatory Visit | Attending: Internal Medicine | Admitting: Internal Medicine

## 2024-12-05 DIAGNOSIS — Z1231 Encounter for screening mammogram for malignant neoplasm of breast: Secondary | ICD-10-CM | POA: Insufficient documentation

## 2024-12-20 NOTE — Progress Notes (Signed)
 Stephanie [name]                                          MRN: [id number]   12/20/2024   The [facility] Quality Team Specialist reviewed this patient medical record for the purposes of chart review for care gap closure. The following were reviewed: chart review for care gap closure-controlling blood pressure.    [facility] Quality Team
